# Patient Record
Sex: Female | Born: 1970 | Race: White | Hispanic: No | Marital: Married | Smoking: Never smoker
Health system: Southern US, Community
[De-identification: ages and names within clinical notes are randomized; demographics above are authoritative.]

## PROBLEM LIST (undated history)

## (undated) DIAGNOSIS — E28319 Asymptomatic premature menopause: Secondary | ICD-10-CM

## (undated) DIAGNOSIS — R87610 Atypical squamous cells of undetermined significance on cytologic smear of cervix (ASC-US): Secondary | ICD-10-CM

## (undated) HISTORY — DX: Asymptomatic premature menopause: E28.319

## (undated) HISTORY — DX: Atypical squamous cells of undetermined significance on cytologic smear of cervix (ASC-US): R87.610

## (undated) HISTORY — PX: CRYOTHERAPY: SHX1416

---

## 2008-03-22 ENCOUNTER — Ambulatory Visit: Payer: Self-pay

## 2008-07-12 HISTORY — PX: OTHER SURGICAL HISTORY: SHX169

## 2009-04-08 HISTORY — PX: COLPOSCOPY: SHX161

## 2011-09-16 ENCOUNTER — Ambulatory Visit: Payer: Self-pay

## 2015-01-01 DIAGNOSIS — R87610 Atypical squamous cells of undetermined significance on cytologic smear of cervix (ASC-US): Secondary | ICD-10-CM

## 2015-01-01 HISTORY — DX: Atypical squamous cells of undetermined significance on cytologic smear of cervix (ASC-US): R87.610

## 2015-08-20 ENCOUNTER — Ambulatory Visit (INDEPENDENT_AMBULATORY_CARE_PROVIDER_SITE_OTHER): Payer: BLUE CROSS/BLUE SHIELD | Admitting: Family Medicine

## 2015-08-20 ENCOUNTER — Encounter: Payer: Self-pay | Admitting: Family Medicine

## 2015-08-20 VITALS — BP 124/76 | HR 80 | Ht 64.0 in | Wt 125.0 lb

## 2015-08-20 DIAGNOSIS — Z136 Encounter for screening for cardiovascular disorders: Secondary | ICD-10-CM

## 2015-08-20 DIAGNOSIS — Z0130 Encounter for examination of blood pressure without abnormal findings: Secondary | ICD-10-CM

## 2015-08-20 NOTE — Progress Notes (Signed)
Name: Tanya Cooper   MRN: [id number]    DOB: Apr 29, 1971   Date:08/20/2015       Progress Note  Subjective  Chief Complaint  Chief Complaint  Patient presents with  . Hypertension    first noticed B/P elevated 3 weeks ago, came back down. Took again today- 178/86    Hypertension This is a new problem. The current episode started 1 to 4 weeks ago. The problem has been waxing and waning since onset. The problem is uncontrolled. Associated symptoms include anxiety and headaches. Pertinent negatives include no blurred vision, chest pain, malaise/fatigue, neck pain, orthopnea, palpitations, peripheral edema, PND, shortness of breath or sweats. Agents associated with hypertension include decongestants and NSAIDs. Risk factors for coronary artery disease include post-menopausal state and stress. Past treatments include nothing. The current treatment provides no improvement. Compliance problems include exercise and diet.  There is no history of angina, kidney disease, CAD/MI, CVA, heart failure, left ventricular hypertrophy, PVD, renovascular disease or retinopathy. There is no history of chronic renal disease or a hypertension causing med.    No problem-specific assessment & plan notes found for this encounter.   No past medical history on file.  No past surgical history on file.  No family history on file.  Social History   Social History  . Marital Status: Married    Spouse Name: N/A  . Number of Children: N/A  . Years of Education: N/A   Occupational History  . Not on file.   Social History Main Topics  . Smoking status: Never Smoker   . Smokeless tobacco: Not on file  . Alcohol Use: 0.0 oz/week    0 Standard drinks or equivalent per week  . Drug Use: No  . Sexual Activity: Not on file   Other Topics Concern  . Not on file   Social History Narrative  . No narrative on file    No Known Allergies   Review of Systems  Constitutional: Negative for fever, chills,  weight loss and malaise/fatigue.  HENT: Negative for ear discharge, ear pain and sore throat.   Eyes: Negative for blurred vision.  Respiratory: Negative for cough, sputum production, shortness of breath and wheezing.   Cardiovascular: Negative for chest pain, palpitations, orthopnea, leg swelling and PND.  Gastrointestinal: Negative for heartburn, nausea, abdominal pain, diarrhea, constipation, blood in stool and melena.  Genitourinary: Negative for dysuria, urgency, frequency and hematuria.  Musculoskeletal: Negative for myalgias, back pain, joint pain and neck pain.  Skin: Negative for rash.  Neurological: Positive for headaches. Negative for dizziness, tingling, sensory change and focal weakness.  Endo/Heme/Allergies: Negative for environmental allergies and polydipsia. Does not bruise/bleed easily.  Psychiatric/Behavioral: Negative for depression and suicidal ideas. The patient is not nervous/anxious and does not have insomnia.      Objective  Filed Vitals:   08/20/15 1539  BP: 124/76  Pulse: 80  Height:  (1.626 m)  Weight: 125 lb (56.7 kg)    Physical Exam  Constitutional: She is well-developed, well-nourished, and in no distress. No distress.  HENT:  Head: Normocephalic and atraumatic.  Right Ear: External ear normal.  Left Ear: External ear normal.  Nose: Nose normal.  Mouth/Throat: Oropharynx is clear and moist.  Eyes: Conjunctivae and EOM are normal. Pupils are equal, round, and reactive to light. Right eye exhibits no discharge. Left eye exhibits no discharge.  Neck: Normal range of motion. Neck supple. No JVD present. No thyromegaly present.  Cardiovascular: Normal rate, regular rhythm, normal heart  sounds and intact distal pulses.  Exam reveals no gallop and no friction rub.   No murmur heard. Pulmonary/Chest: Effort normal and breath sounds normal.  Abdominal: Soft. Bowel sounds are normal. She exhibits no mass. There is no tenderness. There is no guarding.   Musculoskeletal: Normal range of motion. She exhibits no edema.  Lymphadenopathy:    She has no cervical adenopathy.  Neurological: She is alert. She has normal reflexes.  Skin: Skin is warm and dry. She is not diaphoretic.  Psychiatric: Mood and affect normal.      Assessment & Plan  Problem List Items Addressed This Visit    None    Visit Diagnoses    Blood pressure check    -  Primary         Dr. Elizabeth Sauereanna Jones [facility]  08/20/2015

## 2015-08-20 NOTE — Patient Instructions (Signed)
High Blood Pressure: Low-Sodium Diet   Many people find that cutting down on sodium lowers their blood pressure. A low-sodium diet limits the amount of sodium in your diet to no more than 2300 milligrams a day. One teaspoon of salt has about 2300 milligrams of sodium.   Our taste for salt is mainly a habit. When you gradually lower the amount of salt in your diet, your taste begins to change. After a while, food begins to taste better without salt than it did with it.   Dietary Recommendations   Table salt added to foods is a common source of sodium in the diet. By not adding salt to foods, you can reduce the amount of sodium in your diet. But sodium is also found in canned and prepared foods, even if they don't taste salty. Learn which foods to avoid by reading labels to find out how much sodium is in the foods. You can reduce the amount of sodium in your diet by following these guidelines:   Read labels carefully. Look for any form of sodium or salt, such as sodium benzoate or sodium citrate. Choose foods that have less salt.  Add very little or no salt to food that you prepare.  Check the sodium content when you use baking powder, baking soda, and monosodium glutamate (MSG).  Do not add salt to food at the table.  Fast foods are very high in salt, as are many other restaurant foods. When you eat at a restaurant, try steamed fish and vegetables or fresh salads. Avoid soups.  Avoid eating the following foods:  ketchup, prepared mustard, pickles, and olives  soy sauce, steak or barbecue sauce, chili sauce, or Worcestershire sauce  bouillon cubes  commercially prepared or cured meats or fish (for example, bacon, luncheon meats, and canned sardines)  canned vegetables, soups, and other packaged convenience foods  salty cheeses and buttermilk  salted nuts and peanut butter  self-rising flour and biscuit mixes  salted crackers, chips, popcorn, and pretzels  commercial salad dressings  instant  cooked cereals.    Many of these foods are now available in unsalted or low-sodium versions. Read all labels carefully.  If your diet must be restricted to much lower amounts of sodium, talk to your health care provider and a registered dietitian for help in planning your meals. It is important to keep your meals nutritionally balanced and tasty. It can be hard to follow a restricted-salt diet if the food doesn't taste good, but there are many healthy ways to add taste without adding salt or fat.   Use of Salt Substitutes   Ask your health care provider about using salt substitutes. Most salt substitutes contain potassium for flavor. If you are taking certain medications, you may need to be careful about the amount of potassium in your diet.        Substitutions and Hints   Season foods with herbs and spices. Use onions, garlic, parsley, lemon and lime juice and rind, dill weed, basil, tarragon, marjoram, thyme, curry powder, turmeric, cumin, paprika, vinegar, or wine to enhance the flavor and aroma of foods. Mushrooms, celery, red pepper, yellow pepper, green pepper, and dried fruits also enhance specific dishes.  Eat fresh foods (instead of canned or packaged foods) as much as possible. Also, plain frozen fruits and vegetables usually do not have added salt.  Add a pinch of sugar or a squeeze of lemon juice to bring out the flavor in fresh vegetables.  If you must   use canned products, use the low-sodium types (except for fruit). Rinse canned vegetables with tap water before cooking.  Substitute unsalted, polyunsaturated margarine for regular margarine or butter.  Eat low-sodium cheeses. Many are available now, some with herbs and spices that are very tasty, and many are also low-fat.  Drink low-sodium juices.  Make unsalted or lightly salted soup stocks and keep them in the freezer to use as substitutes for canned broth and bouillon. Use these stocks to enhance vegetables.  Substitute  wines and vinegars (especially the flavored vinegars) for salt to enhance flavors.  Eat tuna and salmon and rinse first with running water.  Use herbs such as bay leaf, curry, turmeric, cumin, cilantro, dill, marjoram, paprika, pepper, tarragon, thyme, sage, onions, or garlic to season chicken, beef, or fish.  Cook rice in homemade broth with mushrooms and scallions or shallots.  Help Yourself Become Healthier   Check food labels for sodium and fat content.  Read nutrition information available at your [place] [employer] library, from the [employer], and through nutrition programs and health fairs. Ask your health care provider for printed information on nutrition, diet, and health.  Contact a dietitian for information.  Look for some of the excellent low-sodium cookbooks available in most bookstores.  Take time to plan and enjoy your meals. You will be pleasantly surprised at how fast you learn new food preparations, how lowering your sodium intake lowers your blood pressure, and how good food can be.How to Take Your Blood Pressure HOW DO I GET A BLOOD PRESSURE MACHINE?  You can buy an electronic home blood pressure machine at your local pharmacy. Insurance will sometimes cover the cost if you have a prescription.  Ask your doctor what type of machine is best for you. There are different machines for your arm and your wrist.  If you decide to buy a machine to check your blood pressure on your arm, first check the size of your arm so you can buy the right size cuff. To check the size of your arm:   Use a measuring tape that shows both inches and centimeters.   Wrap the measuring tape around the upper-middle part of your arm. You may need someone to help you measure.   Write down your arm measurement in both inches and centimeters.   To measure your blood pressure correctly, it is important to have the right size cuff.   If your arm is up to 13 inches (up to 34 centimeters), get an  adult cuff size.  If your arm is 13 to 17 inches (35 to 44 centimeters), get a large adult cuff size.    If your arm is 17 to 20 inches (45 to 52 centimeters), get an adult thigh cuff.  WHAT DO THE NUMBERS MEAN?   There are two numbers that make up your blood pressure. For example: 120/80.  The first number (120 in our example) is called the "systolic pressure." It is a measure of the pressure in your blood vessels when your heart is pumping blood.  The second number (80 in our example) is called the "diastolic pressure." It is a measure of the pressure in your blood vessels when your heart is resting between beats.  Your doctor will tell you what your blood pressure should be. WHAT SHOULD I DO BEFORE I CHECK MY BLOOD PRESSURE?   Try to rest or relax for at least 30 minutes before you check your blood pressure.  Do not smoke.  Do  not have any drinks with caffeine, such as:  Soda.  Coffee.  Tea.  Check your blood pressure in a quiet room.  Sit down and stretch out your arm on a table. Keep your arm at about the level of your heart. Let your arm relax.  Make sure that your legs are not crossed. HOW DO I CHECK MY BLOOD PRESSURE?  Follow the directions that came with your machine.  Make sure you remove any tight-fitting clothing from your arm or wrist. Wrap the cuff around your upper arm or wrist. You should be able to fit a finger between the cuff and your arm. If you cannot fit a finger between the cuff and your arm, it is too tight and should be removed and rewrapped.  Some units require you to manually pump up the arm cuff.  Automatic units inflate the cuff when you press a button.  Cuff deflation is automatic in both models.  After the cuff is inflated, the unit measures your blood pressure and pulse. The readings are shown on a monitor. Hold still and breathe normally while the cuff is inflated.  Getting a reading takes less than a minute.  Some models store  readings in a memory. Some provide a printout of readings. If your machine does not store your readings, keep a written record.  Take readings with you to your next visit with your doctor.   This information is not intended to replace advice given to you by your health care provider. Make sure you discuss any questions you have with your health care provider.   Document Released: 05/07/2008 Document Revised: 06/15/2014 Document Reviewed: 07/20/2013 Elsevier Interactive Patient Education [employer]

## 2015-12-02 DIAGNOSIS — H5213 Myopia, bilateral: Secondary | ICD-10-CM | POA: Diagnosis not present

## 2016-01-21 DIAGNOSIS — D485 Neoplasm of uncertain behavior of skin: Secondary | ICD-10-CM | POA: Diagnosis not present

## 2016-01-21 DIAGNOSIS — D225 Melanocytic nevi of trunk: Secondary | ICD-10-CM | POA: Diagnosis not present

## 2016-01-21 DIAGNOSIS — Z1283 Encounter for screening for malignant neoplasm of skin: Secondary | ICD-10-CM | POA: Diagnosis not present

## 2016-01-21 DIAGNOSIS — F4323 Adjustment disorder with mixed anxiety and depressed mood: Secondary | ICD-10-CM | POA: Diagnosis not present

## 2016-02-04 DIAGNOSIS — F4323 Adjustment disorder with mixed anxiety and depressed mood: Secondary | ICD-10-CM | POA: Diagnosis not present

## 2016-02-05 DIAGNOSIS — F4323 Adjustment disorder with mixed anxiety and depressed mood: Secondary | ICD-10-CM | POA: Diagnosis not present

## 2016-03-18 DIAGNOSIS — Z1329 Encounter for screening for other suspected endocrine disorder: Secondary | ICD-10-CM | POA: Diagnosis not present

## 2016-03-18 DIAGNOSIS — Z780 Asymptomatic menopausal state: Secondary | ICD-10-CM | POA: Diagnosis not present

## 2016-03-18 DIAGNOSIS — Z1389 Encounter for screening for other disorder: Secondary | ICD-10-CM | POA: Diagnosis not present

## 2016-03-18 DIAGNOSIS — Z01419 Encounter for gynecological examination (general) (routine) without abnormal findings: Secondary | ICD-10-CM | POA: Diagnosis not present

## 2016-03-26 ENCOUNTER — Other Ambulatory Visit: Payer: Self-pay | Admitting: Obstetrics and Gynecology

## 2016-03-26 ENCOUNTER — Other Ambulatory Visit: Payer: Self-pay | Admitting: Obstetrics & Gynecology

## 2016-03-26 DIAGNOSIS — Z1231 Encounter for screening mammogram for malignant neoplasm of breast: Secondary | ICD-10-CM

## 2016-03-31 ENCOUNTER — Ambulatory Visit
Admission: RE | Admit: 2016-03-31 | Discharge: 2016-03-31 | Disposition: A | Discharge status: Home / Self Care | Payer: BLUE CROSS/BLUE SHIELD | Source: Ambulatory Visit | Attending: Obstetrics and Gynecology | Admitting: Obstetrics and Gynecology

## 2016-03-31 DIAGNOSIS — Z1231 Encounter for screening mammogram for malignant neoplasm of breast: Secondary | ICD-10-CM | POA: Insufficient documentation

## 2017-04-06 ENCOUNTER — Ambulatory Visit (INDEPENDENT_AMBULATORY_CARE_PROVIDER_SITE_OTHER): Payer: BLUE CROSS/BLUE SHIELD | Admitting: Obstetrics and Gynecology

## 2017-04-06 ENCOUNTER — Encounter: Payer: Self-pay | Admitting: Obstetrics and Gynecology

## 2017-04-06 VITALS — BP 118/78 | Ht 64.0 in | Wt 129.0 lb

## 2017-04-06 DIAGNOSIS — Z1339 Encounter for screening examination for other mental health and behavioral disorders: Secondary | ICD-10-CM

## 2017-04-06 DIAGNOSIS — Z23 Encounter for immunization: Secondary | ICD-10-CM | POA: Diagnosis not present

## 2017-04-06 DIAGNOSIS — Z1331 Encounter for screening for depression: Secondary | ICD-10-CM

## 2017-04-06 DIAGNOSIS — Z01419 Encounter for gynecological examination (general) (routine) without abnormal findings: Secondary | ICD-10-CM | POA: Diagnosis not present

## 2017-04-06 DIAGNOSIS — Z124 Encounter for screening for malignant neoplasm of cervix: Secondary | ICD-10-CM

## 2017-04-06 NOTE — Progress Notes (Signed)
Gynecology Annual Exam   PCP: Conard NovakJackson, Yassir Enis D, MD  Chief Complaint  Patient presents with  . Annual Exam   History of Present Illness:  [name] is an established patient, 46 y.o. [phone] who LMP was No LMP recorded. Patient is postmenopausal., presents today for her annual examination.    She does occasionally have vasomotor sx. She uses no meds.  She is single partner, contraception - none. She does not have vaginal dryness.  Last Pap: 01/01/2015  Results were: ASCUS /neg HPV DNA.  Hx of STDs: HPV  Last mammogram: 03/31/2016  Results were: normal--routine follow-up in 12 months There is no FH of breast cancer. There is no FH of ovarian cancer. The patient does not do self-breast exams.  DEXA: has not been screened for osteoporosis  Tobacco use: The patient denies current or previous tobacco use. Alcohol use: social drinker Exercise: moderately active  The patient wears seatbelts: yes.     Past Medical History:  Diagnosis Date  . Abnormal Pap smear of cervix   . ASCUS of cervix with negative high risk HPV    Past Surgical History:  Procedure Laterality Date  . CRYOTHERAPY      Home Medications   Medication Sig Start Date End Date Taking? Authorizing Provider  Cholecalciferol (VITAMIN D PO) Take by mouth.   Yes [provider]  pseudoephedrine (SUDAFED) 120 MG 12 hr tablet Take 120 mg by mouth daily.   Yes [provider]   Allergies:No Known Allergies  Obstetric History: [id number]: G3P3003  Family History  Problem Relation Age of Onset  . Breast cancer Paternal Aunt [id number]       1 aunt and 1 great aunt  . Hypertension Mother     Social History   Social History  . Marital status: Married    Spouse name: N/A  . Number of children: N/A  . Years of education: N/A   Occupational History  . Not on file.   Social History Main Topics  . Smoking status: Never Smoker  . Smokeless tobacco: Never Used  . Alcohol use 0.0 oz/week  . Drug  use: No  . Sexual activity: Yes    Birth control/ protection: Post-menopausal   Other Topics Concern  . Not on file   Social History Narrative  . No narrative on file    Review of Systems  Constitutional: Negative.   HENT: Negative.   Eyes: Negative.   Respiratory: Negative.   Cardiovascular: Negative.   Gastrointestinal: Negative.   Genitourinary: Negative.   Musculoskeletal: Negative.   Skin: Negative.   Neurological: Negative.   Psychiatric/Behavioral: Negative.      Physical Exam BP 118/78   Ht 5\' 4"  (1.626 m)   Wt 129 lb (58.5 kg)   BMI 22.14 kg/m   Physical Exam  Constitutional: She is oriented to person, place, and time. She appears well-developed and well-nourished. No distress.  Genitourinary: Vagina normal and uterus normal. Pelvic exam was performed with patient supine. There is no rash, tenderness or lesion on the right labia. There is no rash, tenderness or lesion on the left labia. Right adnexum does not display mass, does not display tenderness and does not display fullness. Left adnexum does not display mass, does not display tenderness and does not display fullness. Cervix does not exhibit motion tenderness, lesion, discharge or polyp.   Uterus is mobile. Uterus is not enlarged, tender, exhibiting a mass or irregular (is regular).  HENT:  Head: Normocephalic and  atraumatic.  Eyes: EOM are normal. No scleral icterus.  Neck: Normal range of motion. Neck supple. No thyromegaly present.  Cardiovascular: Normal rate and regular rhythm.  Exam reveals no gallop and no friction rub.   No murmur heard. Pulmonary/Chest: Effort normal and breath sounds normal. No respiratory distress. She has no wheezes. She has no rales. Right breast exhibits no inverted nipple, no mass, no nipple discharge, no skin change and no tenderness. Left breast exhibits no inverted nipple, no mass, no nipple discharge, no skin change and no tenderness.  Abdominal: Soft. Bowel sounds are  normal. She exhibits no distension and no mass. There is no tenderness. There is no rebound and no guarding.  Musculoskeletal: Normal range of motion. She exhibits no edema.  Lymphadenopathy:    She has no cervical adenopathy.  Neurological: She is alert and oriented to person, place, and time. No cranial nerve deficit.  Skin: Skin is warm and dry. No erythema.  Psychiatric: She has a normal mood and affect. Her behavior is normal. Judgment normal.   Female chaperone present for pelvic and breast  portions of the physical exam  Results: AUDIT Questionnaire (screen for alcoholism): 3 PHQ-9: 1  Assessment: 46 y.o. G34P3003 female here for routine gynecologic examination.  Plan: Problem List Items Addressed This Visit    None    Visit Diagnoses    Women's annual routine gynecological examination    -  Primary   Relevant Orders   IGP, Aptima HPV, rfx 16/18,45   Screening for depression       Screening for alcohol problem       Pap smear for cervical cancer screening       Relevant Orders   IGP, Aptima HPV, rfx 16/18,45   Vaccine for diphtheria-tetanus-pertussis, combined         Screening: -- Blood pressure screen normal -- Colonoscopy - not due -- Mammogram - due. Patient to call [facility] to arrange. She understands that it is her responsibility to arrange this. -- Weight screening: normal -- Depression screening negative (PHQ-9) -- Nutrition: normal -- cholesterol screening: not due for screening -- osteoporosis screening: not due -- tobacco screening: not using -- alcohol screening: AUDIT questionnaire indicates low-risk usage. -- family history of breast cancer screening: done. not at high risk. -- no evidence of domestic violence or intimate partner violence. -- STD screening: gonorrhea/chlamydia NAAT not collected per patient request. -- pap smear collected per ASCCP guidelines -- flu vaccine received at work in late September -- HPV vaccination series: not eligilbe    -- TDaP today  Thomasene Mohair, MD 04/07/2017 11:25 AM

## 2017-04-07 DIAGNOSIS — Z01419 Encounter for gynecological examination (general) (routine) without abnormal findings: Secondary | ICD-10-CM | POA: Diagnosis not present

## 2017-04-07 DIAGNOSIS — Z23 Encounter for immunization: Secondary | ICD-10-CM | POA: Diagnosis not present

## 2017-04-08 ENCOUNTER — Encounter: Payer: Self-pay | Admitting: Obstetrics and Gynecology

## 2017-04-08 LAB — IGP, APTIMA HPV, RFX 16/18,45
HPV Aptima: NEGATIVE
PAP SMEAR COMMENT: 0

## 2017-05-04 ENCOUNTER — Other Ambulatory Visit: Payer: Self-pay | Admitting: Obstetrics and Gynecology

## 2017-05-04 DIAGNOSIS — Z1231 Encounter for screening mammogram for malignant neoplasm of breast: Secondary | ICD-10-CM

## 2017-05-20 ENCOUNTER — Ambulatory Visit
Admission: RE | Admit: 2017-05-20 | Discharge: 2017-05-20 | Disposition: A | Discharge status: Home / Self Care | Payer: BLUE CROSS/BLUE SHIELD | Source: Ambulatory Visit | Attending: Obstetrics and Gynecology | Admitting: Obstetrics and Gynecology

## 2017-05-20 DIAGNOSIS — Z1231 Encounter for screening mammogram for malignant neoplasm of breast: Secondary | ICD-10-CM | POA: Insufficient documentation

## 2018-02-03 IMAGING — MG MM DIGITAL SCREENING BILAT W/ CAD
4 series · 4 of 4 positions shown · non-contrast
Comparison: Previous exam(s).

CLINICAL DATA: Screening.

EXAM:
DIGITAL SCREENING BILATERAL MAMMOGRAM WITH CAD

[L MLO]
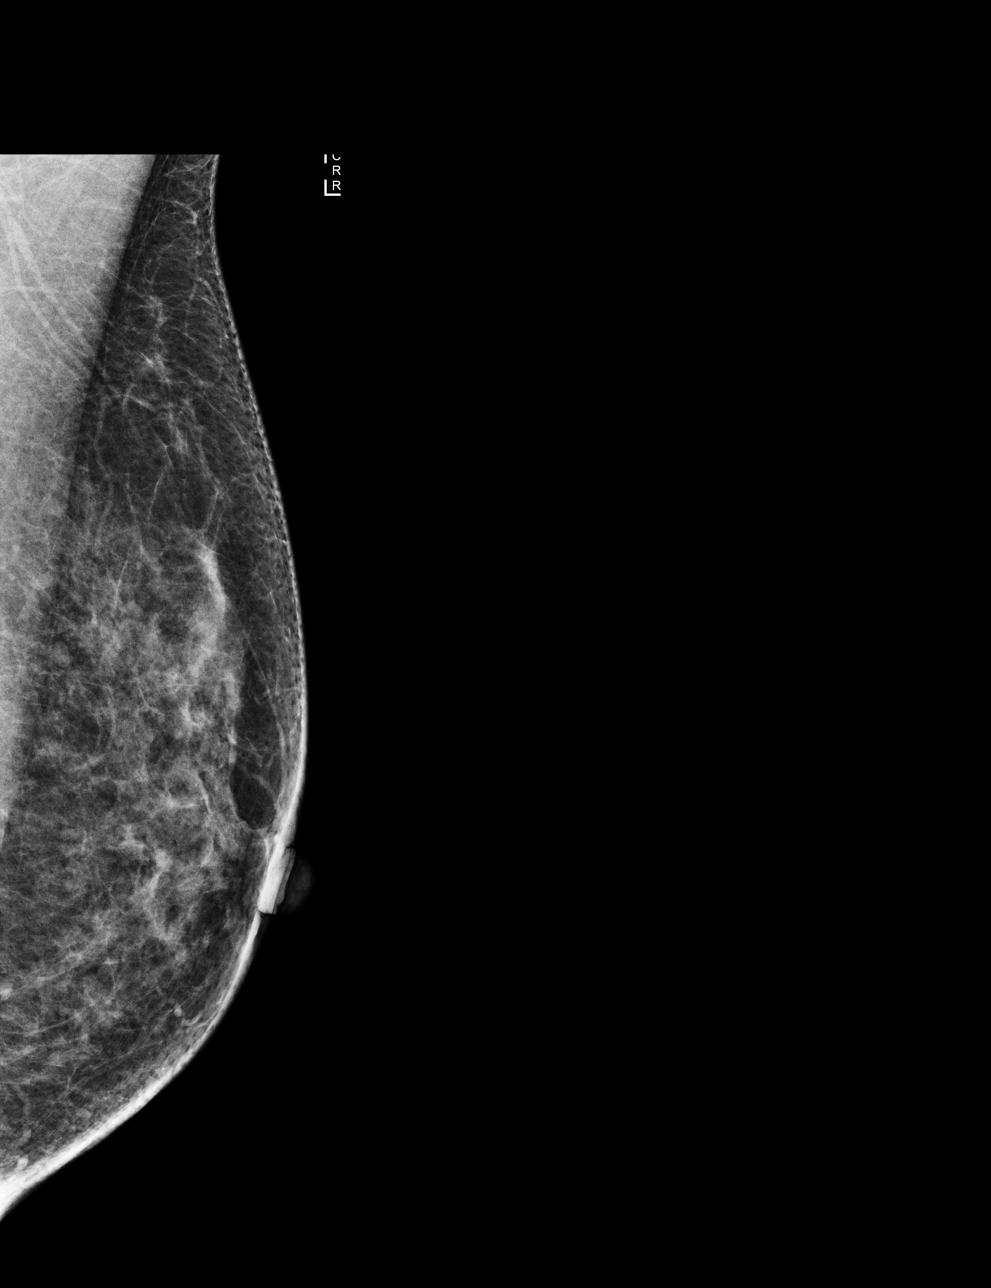

[R CC]
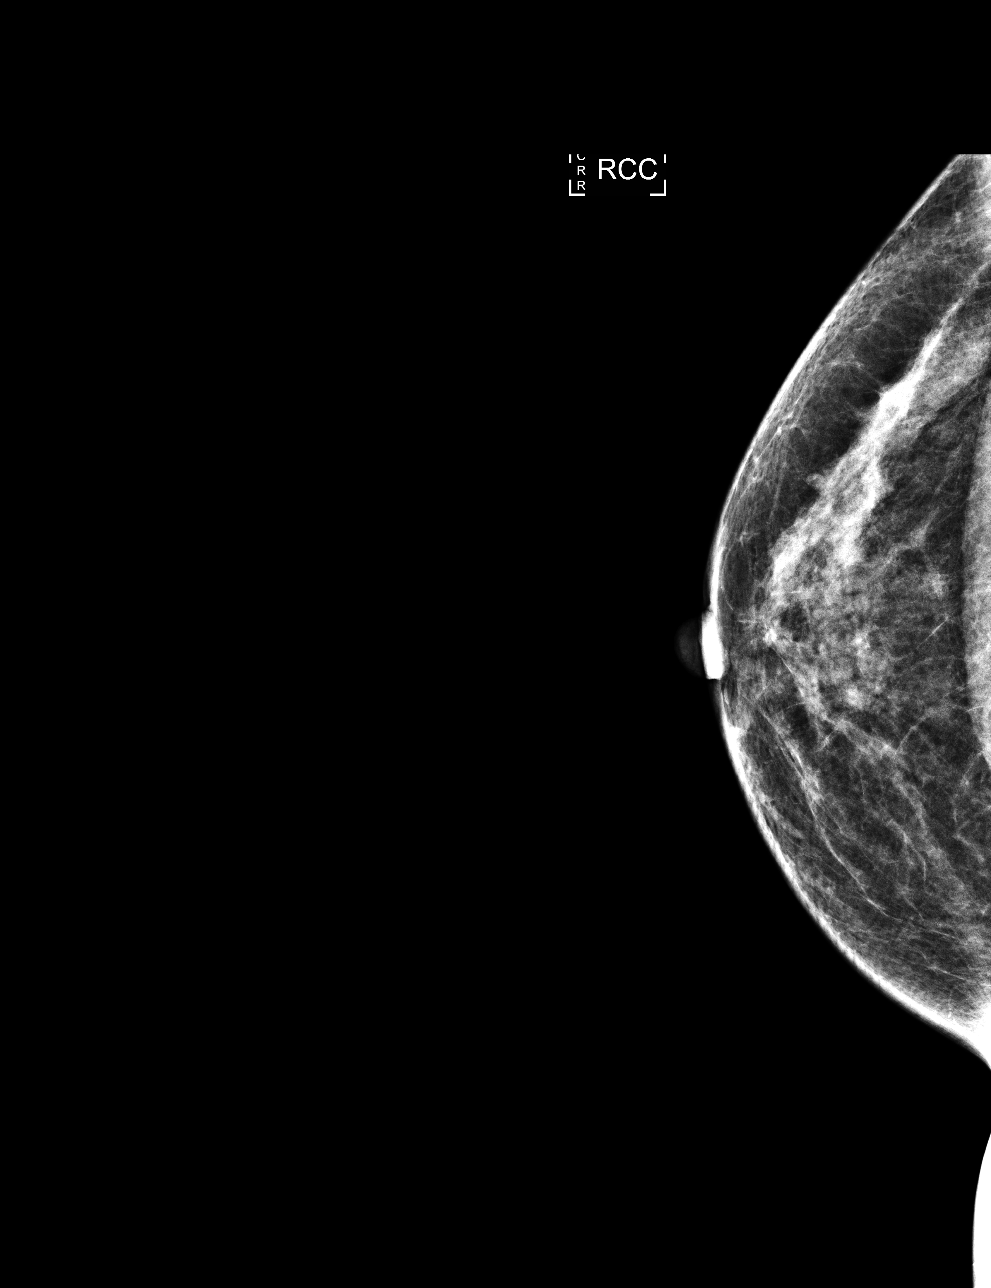

[R MLO]
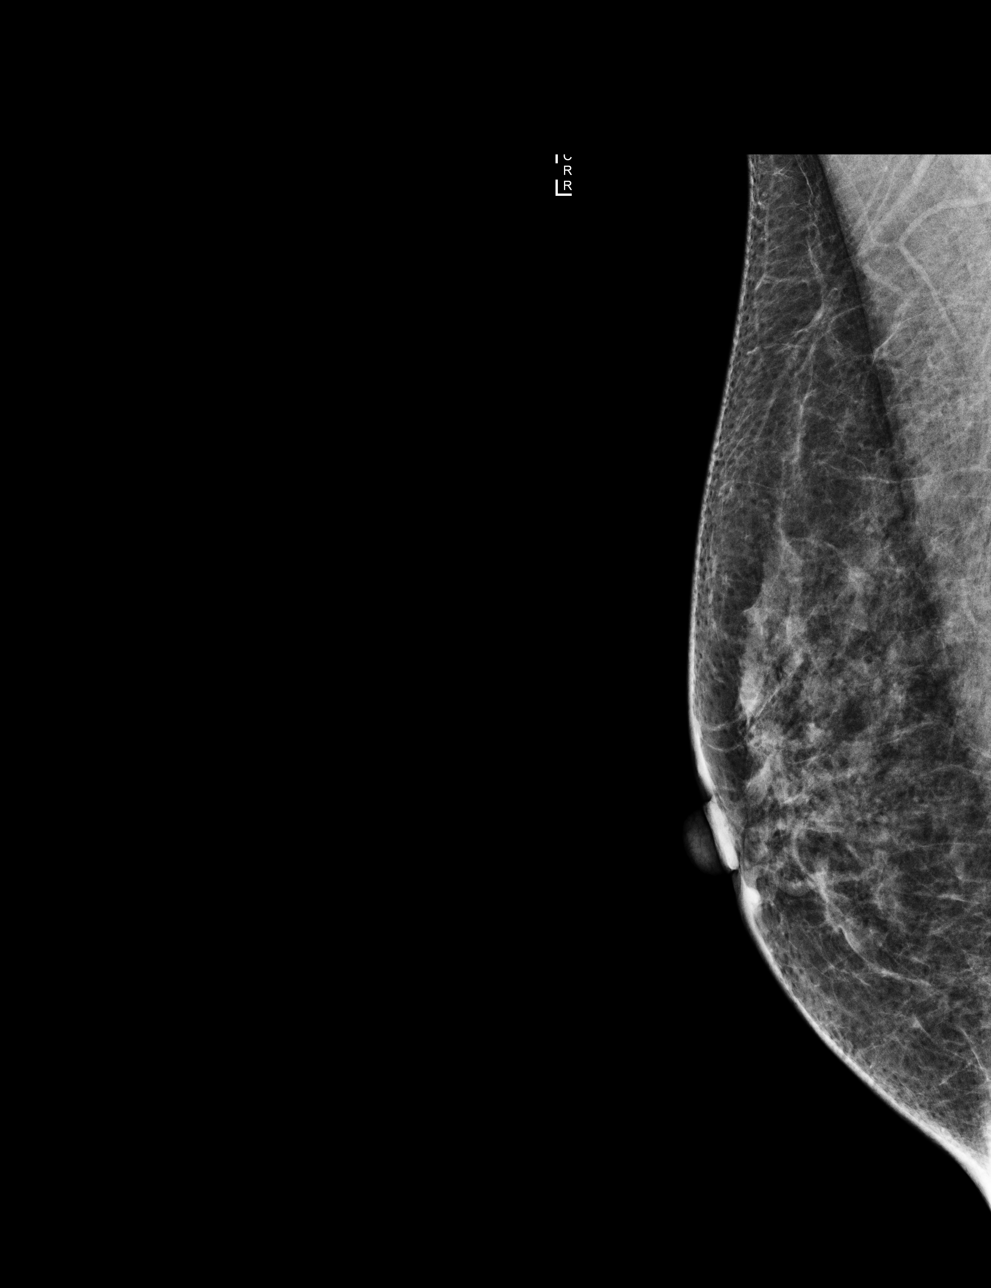

[L CC]
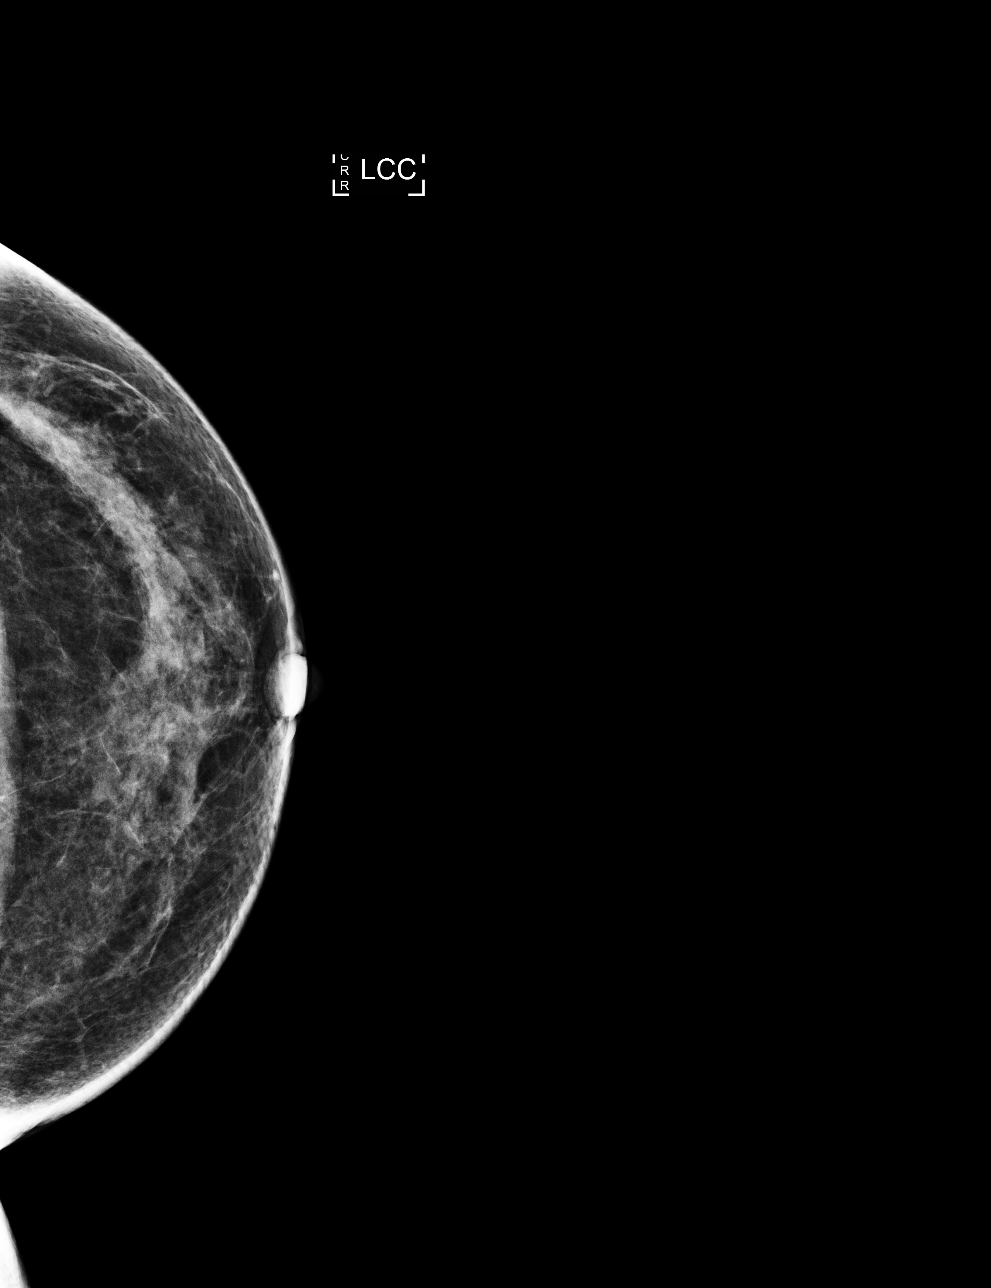

[4 of 4 positions shown; findings below may reference images not displayed]

ACR Breast Density Category b: There are scattered areas of
fibroglandular density.
FINDINGS: There are no findings suspicious for malignancy. Images were
processed with CAD.
IMPRESSION: No mammographic evidence of malignancy. A result letter of this
screening mammogram will be mailed directly to the patient.

RECOMMENDATION:
Screening mammogram in one year. (Code:AS-G-LCT)

BI-RADS CATEGORY  1: Negative.

## 2018-05-18 ENCOUNTER — Other Ambulatory Visit: Payer: Self-pay | Admitting: Obstetrics and Gynecology

## 2018-05-18 ENCOUNTER — Telehealth: Payer: Self-pay

## 2018-05-18 DIAGNOSIS — Z1239 Encounter for other screening for malignant neoplasm of breast: Secondary | ICD-10-CM

## 2018-05-18 NOTE — Telephone Encounter (Signed)
Pt has appt c [facility] in [place] on 06/09/18.  Wants mammogram done the same day in [place].  Needs order sent.  There are only two slots left that day.  [phone]

## 2018-05-18 NOTE — Telephone Encounter (Signed)
Order placed

## 2018-05-19 NOTE — Telephone Encounter (Signed)
Patient is aware the order has been placed, and that she can contact [facility] directly to schedule the appointment.

## 2018-06-09 ENCOUNTER — Encounter: Payer: Self-pay | Admitting: Obstetrics and Gynecology

## 2018-06-09 ENCOUNTER — Ambulatory Visit (INDEPENDENT_AMBULATORY_CARE_PROVIDER_SITE_OTHER): Payer: BLUE CROSS/BLUE SHIELD | Admitting: Obstetrics and Gynecology

## 2018-06-09 VITALS — BP 148/88 | HR 87 | Ht 64.0 in | Wt 134.0 lb

## 2018-06-09 DIAGNOSIS — Z1339 Encounter for screening examination for other mental health and behavioral disorders: Secondary | ICD-10-CM

## 2018-06-09 DIAGNOSIS — Z1331 Encounter for screening for depression: Secondary | ICD-10-CM

## 2018-06-09 DIAGNOSIS — Z01419 Encounter for gynecological examination (general) (routine) without abnormal findings: Secondary | ICD-10-CM

## 2018-06-09 NOTE — Progress Notes (Signed)
Gynecology Annual Exam  PCP: Conard Novak, MD  Chief Complaint  Patient presents with  . Gynecologic Exam   History of Present Illness:  [name] is a 48 y.o. [phone] who LMP was No LMP recorded. Patient is postmenopausal., presents today for her annual examination.    She does have vasomotor sx. She has occasional hot flashes.  She is sexually active. She is menopausal. She does not have vaginal dryness.  Last Pap:   04/06/2017: NILM, HPV negative 12/2014: ASCUS: HPV negative 12/2013: NILM, HPV negative 12/2012: NILM, HPV negative 05/2011: NILM, HPV negative  Hx of STDs: HPV  Last mammogram: 1 year  Results were: normal--routine follow-up in 12 months There is no FH of breast cancer. There is no FH of ovarian cancer. The patient does not do self-breast exams.  DEXA: has not been screened for osteoporosis  Tobacco use: The patient denies current or previous tobacco use. Alcohol use: social drinker Exercise: moderately active  The patient wears seatbelts: yes.     Past Medical History:  Diagnosis Date  . ASCUS of cervix with negative high risk HPV 01/01/2015   [place]  . Premature menopause     Past Surgical History:  Procedure Laterality Date  . COLPOSCOPY  04/2009  . CRYOTHERAPY    . Paragard  07/12/2008    Prior to Admission medications   Medication Sig Start Date End Date Taking? Authorizing Provider  Cholecalciferol (VITAMIN D PO) Take by mouth.    [provider]  pseudoephedrine (SUDAFED) 120 MG 12 hr tablet Take 120 mg by mouth daily.    [provider]   Allergies: No Known Allergies  Obstetric History: [id number]  Family History  Problem Relation Age of Onset  . Breast cancer Paternal Aunt 68       1 aunt and 1 great aunt  . Hypertension Mother     Social History   Socioeconomic History  . Marital status: Married    Spouse name: Not on file  . Number of children: Not on file  . Years of education: Not  on file  . Highest education level: Not on file  Occupational History  . Not on file  Social Needs  . Financial resource strain: Not on file  . Food insecurity:    Worry: Not on file    Inability: Not on file  . Transportation needs:    Medical: Not on file    Non-medical: Not on file  Tobacco Use  . Smoking status: Never Smoker  . Smokeless tobacco: Never Used  Substance and Sexual Activity  . Alcohol use: Yes    Alcohol/week: 0.0 standard drinks  . Drug use: No  . Sexual activity: Yes    Birth control/protection: Post-menopausal  Lifestyle  . Physical activity:    Days per week: Not on file    Minutes per session: Not on file  . Stress: Not on file  Relationships  . Social connections:    Talks on phone: Not on file    Gets together: Not on file    Attends religious service: Not on file    Active member of club or organization: Not on file    Attends meetings of clubs or organizations: Not on file    Relationship status: Not on file  . Intimate partner violence:    Fear of current or ex partner: Not on file    Emotionally abused: Not on file    Physically abused: Not on file  Forced sexual activity: Not on file  Other Topics Concern  . Not on file  Social History Narrative  . Not on file   Review of Systems  Constitutional: Negative.   HENT: Negative.   Eyes: Negative.   Respiratory: Negative.   Cardiovascular: Negative.   Gastrointestinal: Negative.   Genitourinary: Negative.   Musculoskeletal: Negative.   Skin: Negative.   Neurological: Negative.   Psychiatric/Behavioral: Negative.     Physical Exam BP (!) 148/88   Pulse 87   Ht 5\' 4"  (1.626 m)   Wt 134 lb (60.8 kg)   BMI 23.00 kg/m   Physical Exam Constitutional:      General: She is not in acute distress.    Appearance: She is well-developed.  Genitourinary:     Pelvic exam was performed with patient supine.     Uterus normal.     No inguinal adenopathy present in the right or left  side.    No signs of injury in the vagina.     No vaginal discharge, erythema, tenderness or bleeding.     No cervical motion tenderness, discharge, lesion or polyp.     Uterus is mobile.     Uterus is not enlarged or tender.     No uterine mass detected.    Uterus is anteverted.     No right or left adnexal mass present.     Right adnexa not tender or full.     Left adnexa not tender or full.  HENT:     Head: Normocephalic and atraumatic.  Eyes:     General: No scleral icterus. Neck:     Musculoskeletal: Normal range of motion and neck supple.     Thyroid: No thyromegaly.  Cardiovascular:     Rate and Rhythm: Normal rate and regular rhythm.     Heart sounds: No murmur. No friction rub. No gallop.   Pulmonary:     Effort: Pulmonary effort is normal. No respiratory distress.     Breath sounds: Normal breath sounds. No wheezing or rales.  Chest:     Breasts:        Right: No inverted nipple, mass, nipple discharge, skin change or tenderness.        Left: No inverted nipple, mass, nipple discharge, skin change or tenderness.  Abdominal:     General: Bowel sounds are normal. There is no distension.     Palpations: Abdomen is soft. There is no mass.     Tenderness: There is no abdominal tenderness. There is no guarding or rebound.  Musculoskeletal: Normal range of motion.        General: No tenderness.  Lymphadenopathy:     Cervical: No cervical adenopathy.     Lower Body: No right inguinal adenopathy. No left inguinal adenopathy.  Neurological:     Mental Status: She is alert and oriented to person, place, and time.     Cranial Nerves: No cranial nerve deficit.  Skin:    General: Skin is warm and dry.     Findings: No erythema or rash.  Psychiatric:        Behavior: Behavior normal.        Judgment: Judgment normal.    Female chaperone present for pelvic and breast  portions of the physical exam  Results: AUDIT Questionnaire (screen for alcoholism): 2 PHQ-9:  2  Assessment: 48 y.o. [id number] female here for routine gynecologic examination.  Plan: Problem List Items Addressed This Visit    None  Visit Diagnoses    Women's annual routine gynecological examination    -  Primary   Screening for depression       Screening for alcoholism          Screening: -- Blood pressure screen elevated: continued to monitor. -- Colonoscopy - not due -- [facility] - [facility] - [facility] -- Weight screening: normal -- Depression screening negative (PHQ-9) -- Nutrition: normal -- cholesterol screening: per PCP -- osteoporosis screening: not due -- tobacco screening: not using -- alcohol screening: AUDIT questionnaire indicates low-risk usage. -- family history of breast cancer screening: done. not at high risk. -- no evidence of domestic violence or intimate partner violence. -- STD screening: gonorrhea/chlamydia NAAT not collected per patient request. -- pap smear not collected per ASCCP guidelines -- flu vaccine declined this year -- HPV vaccination series: not eligilbe  Thomasene MohairStephen Raylee Adamec, MD 06/09/2018 4:13 PM

## 2018-06-13 DIAGNOSIS — H5213 Myopia, bilateral: Secondary | ICD-10-CM | POA: Diagnosis not present

## 2018-06-20 ENCOUNTER — Ambulatory Visit
Admission: RE | Admit: 2018-06-20 | Discharge: 2018-06-20 | Disposition: A | Discharge status: Home / Self Care | Payer: BLUE CROSS/BLUE SHIELD | Source: Ambulatory Visit | Attending: Obstetrics and Gynecology | Admitting: Obstetrics and Gynecology

## 2018-06-20 DIAGNOSIS — Z1239 Encounter for other screening for malignant neoplasm of breast: Secondary | ICD-10-CM | POA: Insufficient documentation

## 2018-06-20 DIAGNOSIS — Z1231 Encounter for screening mammogram for malignant neoplasm of breast: Secondary | ICD-10-CM | POA: Diagnosis not present

## 2018-10-11 DIAGNOSIS — T180XXA Foreign body in mouth, initial encounter: Secondary | ICD-10-CM | POA: Diagnosis not present

## 2018-11-10 DIAGNOSIS — M25551 Pain in right hip: Secondary | ICD-10-CM | POA: Diagnosis not present

## 2018-11-10 DIAGNOSIS — M25552 Pain in left hip: Secondary | ICD-10-CM | POA: Diagnosis not present

## 2018-11-10 DIAGNOSIS — S76311A Strain of muscle, fascia and tendon of the posterior muscle group at thigh level, right thigh, initial encounter: Secondary | ICD-10-CM | POA: Diagnosis not present

## 2018-11-10 DIAGNOSIS — S76312A Strain of muscle, fascia and tendon of the posterior muscle group at thigh level, left thigh, initial encounter: Secondary | ICD-10-CM | POA: Diagnosis not present

## 2018-11-11 DIAGNOSIS — M25551 Pain in right hip: Secondary | ICD-10-CM | POA: Diagnosis not present

## 2018-11-11 DIAGNOSIS — S76312A Strain of muscle, fascia and tendon of the posterior muscle group at thigh level, left thigh, initial encounter: Secondary | ICD-10-CM | POA: Diagnosis not present

## 2018-11-11 DIAGNOSIS — M25552 Pain in left hip: Secondary | ICD-10-CM | POA: Diagnosis not present

## 2018-11-11 DIAGNOSIS — S76311A Strain of muscle, fascia and tendon of the posterior muscle group at thigh level, right thigh, initial encounter: Secondary | ICD-10-CM | POA: Diagnosis not present

## 2018-11-15 DIAGNOSIS — S76312A Strain of muscle, fascia and tendon of the posterior muscle group at thigh level, left thigh, initial encounter: Secondary | ICD-10-CM | POA: Diagnosis not present

## 2018-11-15 DIAGNOSIS — M25552 Pain in left hip: Secondary | ICD-10-CM | POA: Diagnosis not present

## 2018-11-15 DIAGNOSIS — M25551 Pain in right hip: Secondary | ICD-10-CM | POA: Diagnosis not present

## 2018-11-15 DIAGNOSIS — S76311A Strain of muscle, fascia and tendon of the posterior muscle group at thigh level, right thigh, initial encounter: Secondary | ICD-10-CM | POA: Diagnosis not present

## 2018-11-18 DIAGNOSIS — S76312A Strain of muscle, fascia and tendon of the posterior muscle group at thigh level, left thigh, initial encounter: Secondary | ICD-10-CM | POA: Diagnosis not present

## 2018-11-18 DIAGNOSIS — S76311A Strain of muscle, fascia and tendon of the posterior muscle group at thigh level, right thigh, initial encounter: Secondary | ICD-10-CM | POA: Diagnosis not present

## 2018-11-18 DIAGNOSIS — M25551 Pain in right hip: Secondary | ICD-10-CM | POA: Diagnosis not present

## 2018-11-18 DIAGNOSIS — M25552 Pain in left hip: Secondary | ICD-10-CM | POA: Diagnosis not present

## 2018-11-22 DIAGNOSIS — M25551 Pain in right hip: Secondary | ICD-10-CM | POA: Diagnosis not present

## 2018-11-22 DIAGNOSIS — S76312A Strain of muscle, fascia and tendon of the posterior muscle group at thigh level, left thigh, initial encounter: Secondary | ICD-10-CM | POA: Diagnosis not present

## 2018-11-22 DIAGNOSIS — M25552 Pain in left hip: Secondary | ICD-10-CM | POA: Diagnosis not present

## 2018-11-22 DIAGNOSIS — S76311A Strain of muscle, fascia and tendon of the posterior muscle group at thigh level, right thigh, initial encounter: Secondary | ICD-10-CM | POA: Diagnosis not present

## 2018-11-25 DIAGNOSIS — S76312A Strain of muscle, fascia and tendon of the posterior muscle group at thigh level, left thigh, initial encounter: Secondary | ICD-10-CM | POA: Diagnosis not present

## 2018-11-25 DIAGNOSIS — S76311A Strain of muscle, fascia and tendon of the posterior muscle group at thigh level, right thigh, initial encounter: Secondary | ICD-10-CM | POA: Diagnosis not present

## 2018-11-25 DIAGNOSIS — M25552 Pain in left hip: Secondary | ICD-10-CM | POA: Diagnosis not present

## 2018-11-25 DIAGNOSIS — M25551 Pain in right hip: Secondary | ICD-10-CM | POA: Diagnosis not present

## 2018-11-29 DIAGNOSIS — M25552 Pain in left hip: Secondary | ICD-10-CM | POA: Diagnosis not present

## 2018-11-29 DIAGNOSIS — S76311A Strain of muscle, fascia and tendon of the posterior muscle group at thigh level, right thigh, initial encounter: Secondary | ICD-10-CM | POA: Diagnosis not present

## 2018-11-29 DIAGNOSIS — S76312A Strain of muscle, fascia and tendon of the posterior muscle group at thigh level, left thigh, initial encounter: Secondary | ICD-10-CM | POA: Diagnosis not present

## 2018-11-29 DIAGNOSIS — M25551 Pain in right hip: Secondary | ICD-10-CM | POA: Diagnosis not present

## 2018-12-01 DIAGNOSIS — S76312A Strain of muscle, fascia and tendon of the posterior muscle group at thigh level, left thigh, initial encounter: Secondary | ICD-10-CM | POA: Diagnosis not present

## 2018-12-01 DIAGNOSIS — M25552 Pain in left hip: Secondary | ICD-10-CM | POA: Diagnosis not present

## 2018-12-01 DIAGNOSIS — M25551 Pain in right hip: Secondary | ICD-10-CM | POA: Diagnosis not present

## 2018-12-01 DIAGNOSIS — S76311A Strain of muscle, fascia and tendon of the posterior muscle group at thigh level, right thigh, initial encounter: Secondary | ICD-10-CM | POA: Diagnosis not present

## 2018-12-06 DIAGNOSIS — S76311A Strain of muscle, fascia and tendon of the posterior muscle group at thigh level, right thigh, initial encounter: Secondary | ICD-10-CM | POA: Diagnosis not present

## 2018-12-06 DIAGNOSIS — M25552 Pain in left hip: Secondary | ICD-10-CM | POA: Diagnosis not present

## 2018-12-06 DIAGNOSIS — S76312A Strain of muscle, fascia and tendon of the posterior muscle group at thigh level, left thigh, initial encounter: Secondary | ICD-10-CM | POA: Diagnosis not present

## 2018-12-06 DIAGNOSIS — M25551 Pain in right hip: Secondary | ICD-10-CM | POA: Diagnosis not present

## 2018-12-16 DIAGNOSIS — S76312A Strain of muscle, fascia and tendon of the posterior muscle group at thigh level, left thigh, initial encounter: Secondary | ICD-10-CM | POA: Diagnosis not present

## 2018-12-16 DIAGNOSIS — M25552 Pain in left hip: Secondary | ICD-10-CM | POA: Diagnosis not present

## 2018-12-16 DIAGNOSIS — M25551 Pain in right hip: Secondary | ICD-10-CM | POA: Diagnosis not present

## 2018-12-16 DIAGNOSIS — S76311A Strain of muscle, fascia and tendon of the posterior muscle group at thigh level, right thigh, initial encounter: Secondary | ICD-10-CM | POA: Diagnosis not present

## 2018-12-19 DIAGNOSIS — M25552 Pain in left hip: Secondary | ICD-10-CM | POA: Diagnosis not present

## 2018-12-19 DIAGNOSIS — S76312A Strain of muscle, fascia and tendon of the posterior muscle group at thigh level, left thigh, initial encounter: Secondary | ICD-10-CM | POA: Diagnosis not present

## 2018-12-19 DIAGNOSIS — S76311A Strain of muscle, fascia and tendon of the posterior muscle group at thigh level, right thigh, initial encounter: Secondary | ICD-10-CM | POA: Diagnosis not present

## 2018-12-19 DIAGNOSIS — M25551 Pain in right hip: Secondary | ICD-10-CM | POA: Diagnosis not present

## 2018-12-22 DIAGNOSIS — M25551 Pain in right hip: Secondary | ICD-10-CM | POA: Diagnosis not present

## 2018-12-22 DIAGNOSIS — S76312A Strain of muscle, fascia and tendon of the posterior muscle group at thigh level, left thigh, initial encounter: Secondary | ICD-10-CM | POA: Diagnosis not present

## 2018-12-22 DIAGNOSIS — M25552 Pain in left hip: Secondary | ICD-10-CM | POA: Diagnosis not present

## 2018-12-22 DIAGNOSIS — S76311A Strain of muscle, fascia and tendon of the posterior muscle group at thigh level, right thigh, initial encounter: Secondary | ICD-10-CM | POA: Diagnosis not present

## 2018-12-30 DIAGNOSIS — S76311A Strain of muscle, fascia and tendon of the posterior muscle group at thigh level, right thigh, initial encounter: Secondary | ICD-10-CM | POA: Diagnosis not present

## 2018-12-30 DIAGNOSIS — M25551 Pain in right hip: Secondary | ICD-10-CM | POA: Diagnosis not present

## 2018-12-30 DIAGNOSIS — S76312A Strain of muscle, fascia and tendon of the posterior muscle group at thigh level, left thigh, initial encounter: Secondary | ICD-10-CM | POA: Diagnosis not present

## 2018-12-30 DIAGNOSIS — M25552 Pain in left hip: Secondary | ICD-10-CM | POA: Diagnosis not present

## 2019-05-25 DIAGNOSIS — Z03818 Encounter for observation for suspected exposure to other biological agents ruled out: Secondary | ICD-10-CM | POA: Diagnosis not present

## 2019-06-16 DIAGNOSIS — H5213 Myopia, bilateral: Secondary | ICD-10-CM | POA: Diagnosis not present

## 2019-11-15 ENCOUNTER — Ambulatory Visit: Payer: BLUE CROSS/BLUE SHIELD | Admitting: Obstetrics and Gynecology

## 2020-01-03 DIAGNOSIS — Z713 Dietary counseling and surveillance: Secondary | ICD-10-CM | POA: Diagnosis not present

## 2020-01-31 DIAGNOSIS — Z713 Dietary counseling and surveillance: Secondary | ICD-10-CM | POA: Diagnosis not present

## 2020-03-01 DIAGNOSIS — Z713 Dietary counseling and surveillance: Secondary | ICD-10-CM | POA: Diagnosis not present

## 2020-03-29 DIAGNOSIS — Z713 Dietary counseling and surveillance: Secondary | ICD-10-CM | POA: Diagnosis not present

## 2020-05-08 ENCOUNTER — Ambulatory Visit (INDEPENDENT_AMBULATORY_CARE_PROVIDER_SITE_OTHER): Payer: BC Managed Care – PPO | Admitting: Obstetrics and Gynecology

## 2020-05-08 ENCOUNTER — Other Ambulatory Visit: Payer: Self-pay

## 2020-05-08 ENCOUNTER — Encounter: Payer: Self-pay | Admitting: Obstetrics and Gynecology

## 2020-05-08 VITALS — BP 134/82 | HR 76 | Resp 16 | Ht 64.0 in | Wt 126.6 lb

## 2020-05-08 DIAGNOSIS — Z1339 Encounter for screening examination for other mental health and behavioral disorders: Secondary | ICD-10-CM | POA: Diagnosis not present

## 2020-05-08 DIAGNOSIS — Z01419 Encounter for gynecological examination (general) (routine) without abnormal findings: Secondary | ICD-10-CM

## 2020-05-08 DIAGNOSIS — Z1331 Encounter for screening for depression: Secondary | ICD-10-CM

## 2020-05-08 DIAGNOSIS — Z1231 Encounter for screening mammogram for malignant neoplasm of breast: Secondary | ICD-10-CM

## 2020-05-08 NOTE — Progress Notes (Signed)
Gynecology Annual Exam  PCP: Conard Novak, MD  Chief Complaint  Patient presents with  . Gynecologic Exam    ANNUAL EXAM   History of Present Illness:  [name] is a 49 y.o. [phone] who LMP was No LMP recorded. Patient is postmenopausal., presents today for her annual examination.    She does have vasomotor sx that are off and on (hot flashes)  She is sexually active. Her husband has had a vasectomy. She does have vaginal dryness (a little). She uses lubricant OTC as needed.   Last Pap: 03/2017  Results were: no abnormalities /neg HPV DNA negative Hx of STDs: none  Last mammogram: 06/2018  Results were: normal--routine follow-up in 12 months There is a FH of breast cancer in her maternal and paternal aunts. There is no FH of ovarian cancer. The patient does do self-breast exams.  Colonoscopy: never had DEXA: has not been screened for osteoporosis  Tobacco use: The patient denies current or previous tobacco use. Alcohol use: social drinker Exercise: runs 4 days per week, 20-25 miles per week.   The patient wears seatbelts: yes.     Past Medical History:  Diagnosis Date  . ASCUS of cervix with negative high risk HPV 01/01/2015   [place]  . Premature menopause     Past Surgical History:  Procedure Laterality Date  . COLPOSCOPY  04/2009  . CRYOTHERAPY    . Paragard  07/12/2008    Prior to Admission medications   Medication Sig Start Date End Date Taking? Authorizing Provider  Cholecalciferol (VITAMIN D PO) Take by mouth.    [provider]   Allergies:  No Known Allergies  Obstetric History: [id number]  Family History  Problem Relation Age of Onset  . Breast cancer Paternal Aunt 38       1 aunt and 1 great aunt  . Hypertension Mother   . Breast cancer Maternal Aunt 37    Social History   Socioeconomic History  . Marital status: Married    Spouse name: Not on file  . Number of children: Not on file  . Years of education: Not  on file  . Highest education level: Not on file  Occupational History  . Not on file  Tobacco Use  . Smoking status: Never Smoker  . Smokeless tobacco: Never Used  Substance and Sexual Activity  . Alcohol use: Yes    Alcohol/week: 0.0 standard drinks  . Drug use: No  . Sexual activity: Yes    Birth control/protection: Post-menopausal  Other Topics Concern  . Not on file  Social History Narrative  . Not on file   Social Determinants of Health   Financial Resource Strain:   . Difficulty of Paying Living Expenses: Not on file  Food Insecurity:   . Worried About Programme researcher, broadcasting/film/video in the Last Year: Not on file  . Ran Out of Food in the Last Year: Not on file  Transportation Needs:   . Lack of Transportation (Medical): Not on file  . Lack of Transportation (Non-Medical): Not on file  Physical Activity:   . Days of Exercise per Week: Not on file  . Minutes of Exercise per Session: Not on file  Stress:   . Feeling of Stress : Not on file  Social Connections:   . Frequency of Communication with Friends and Family: Not on file  . Frequency of Social Gatherings with Friends and Family: Not on file  . Attends Religious Services: Not on  file  . Active Member of Clubs or Organizations: Not on file  . Attends Banker Meetings: Not on file  . Marital Status: Not on file  Intimate Partner Violence:   . Fear of Current or Ex-Partner: Not on file  . Emotionally Abused: Not on file  . Physically Abused: Not on file  . Sexually Abused: Not on file    Review of Systems  Constitutional: Negative.   HENT: Negative.   Eyes: Negative.   Respiratory: Negative.   Cardiovascular: Negative.   Gastrointestinal: Negative.   Genitourinary: Negative.   Musculoskeletal: Negative.   Skin: Negative.   Neurological: Negative.   Psychiatric/Behavioral: Negative.      Physical Exam BP 134/82   Pulse 76   Resp 16   Ht 5\' 4"  (1.626 m)   Wt 126 lb 9.6 oz (57.4 kg)   SpO2 100%    BMI 21.73 kg/m   Physical Exam Constitutional:      General: She is not in acute distress.    Appearance: Normal appearance. She is well-developed.  Genitourinary:     Pelvic exam was performed with patient in the lithotomy position.     Vulva, urethra, bladder and uterus normal.     No inguinal adenopathy present in the right or left side.    No signs of injury in the vagina.     No vaginal discharge, erythema, tenderness or bleeding.     No cervical motion tenderness, discharge, lesion or polyp.     Uterus is mobile.     Uterus is not enlarged or tender.     No uterine mass detected.    Uterus is anteverted.     No right or left adnexal mass present.     Right adnexa not tender or full.     Left adnexa not tender or full.  HENT:     Head: Normocephalic and atraumatic.  Eyes:     General: No scleral icterus.    Conjunctiva/sclera: Conjunctivae normal.  Neck:     Thyroid: No thyromegaly.  Cardiovascular:     Rate and Rhythm: Normal rate and regular rhythm.     Heart sounds: No murmur heard.  No friction rub. No gallop.   Pulmonary:     Effort: Pulmonary effort is normal. No respiratory distress.     Breath sounds: Normal breath sounds. No wheezing or rales.  Chest:     Breasts:        Right: No inverted nipple, mass, nipple discharge, skin change or tenderness.        Left: No inverted nipple, mass, nipple discharge, skin change or tenderness.  Abdominal:     General: Bowel sounds are normal. There is no distension.     Palpations: Abdomen is soft. There is no mass.     Tenderness: There is no abdominal tenderness. There is no guarding or rebound.  Musculoskeletal:        General: No swelling or tenderness. Normal range of motion.     Cervical back: Normal range of motion and neck supple.  Lymphadenopathy:     Cervical: No cervical adenopathy.     Lower Body: No right inguinal adenopathy. No left inguinal adenopathy.  Neurological:     General: No focal deficit  present.     Mental Status: She is alert and oriented to person, place, and time.     Cranial Nerves: No cranial nerve deficit.  Skin:    General: Skin is warm and dry.  Findings: No erythema or rash.  Psychiatric:        Mood and Affect: Mood normal.        Behavior: Behavior normal.        Judgment: Judgment normal.     Female chaperone present for pelvic and breast  portions of the physical exam  Results: AUDIT Questionnaire (screen for alcoholism): 3 PHQ-9: 0  Assessment: 49 y.o. [id number] female here for routine gynecologic examination.  Plan: Problem List Items Addressed This Visit    None    Visit Diagnoses    Women's annual routine gynecological examination    -  Primary   Relevant Orders   MM Digital Screening   Screening for depression       Screening for alcoholism       Encounter for screening mammogram for malignant neoplasm of breast       Relevant Orders   MM Digital Screening      Screening: -- Blood pressure screen normal -- Colonoscopy - due - will schedule, likely once she turns 49 yo (per her request). She may elect to utilize Cologard.   -- Mammogram - due. Patient to call [facility] to arrange. She understands that it is her responsibility to arrange this. -- Weight screening: normal -- Depression screening negative (PHQ-9) -- Nutrition: normal -- cholesterol screening: per PCP -- osteoporosis screening: not due -- tobacco screening: not using -- alcohol screening: AUDIT questionnaire indicates low-risk usage. -- family history of breast cancer screening: done. not at high risk. -- no evidence of domestic violence or intimate partner violence. -- STD screening: gonorrhea/chlamydia NAAT not collected per patient request. -- pap smear not collected per ASCCP guidelines -- flu vaccine usually declines to get it. -- HPV vaccination series: not eligilbe  -- COVID19 vaccine: received Clearnce Hasten, MD 05/08/2020 1:20 PM

## 2020-07-30 ENCOUNTER — Other Ambulatory Visit: Payer: Self-pay | Admitting: Obstetrics and Gynecology

## 2020-07-30 DIAGNOSIS — Z1231 Encounter for screening mammogram for malignant neoplasm of breast: Secondary | ICD-10-CM

## 2020-08-20 ENCOUNTER — Ambulatory Visit
Admission: RE | Admit: 2020-08-20 | Discharge: 2020-08-20 | Disposition: A | Discharge status: Home / Self Care | Payer: 59 | Source: Ambulatory Visit | Attending: Obstetrics and Gynecology | Admitting: Obstetrics and Gynecology

## 2020-08-20 ENCOUNTER — Other Ambulatory Visit: Payer: Self-pay

## 2020-08-20 DIAGNOSIS — Z1231 Encounter for screening mammogram for malignant neoplasm of breast: Secondary | ICD-10-CM

## 2020-08-22 ENCOUNTER — Ambulatory Visit
Admission: RE | Admit: 2020-08-22 | Discharge: 2020-08-22 | Disposition: A | Discharge status: Home / Self Care | Payer: 59 | Source: Ambulatory Visit | Attending: Obstetrics and Gynecology | Admitting: Obstetrics and Gynecology

## 2020-08-22 ENCOUNTER — Other Ambulatory Visit: Payer: Self-pay

## 2020-08-22 DIAGNOSIS — Z1231 Encounter for screening mammogram for malignant neoplasm of breast: Secondary | ICD-10-CM | POA: Insufficient documentation

## 2021-08-07 NOTE — Progress Notes (Signed)
? ?PCP: Conard Novak, MD ? ? ?Chief Complaint  ?Patient presents with  ? Annual Exam  ? Menopause  ?  Menopausal symptoms  ? ? ?HPI: ?     [name] is a 51 y.o. [phone] whose LMP was No LMP recorded. Patient is postmenopausal., presents today for her annual examination.  Her menses are absent due to premature menopause age 30; not interested in HRT. No PMB. She does not have vasomotor sx.  ? ?Has had issues with wt gain over past few yrs. Is extremely active, eats well. Frustrated because weight hasn't changed.  ? ?Sex activity: single partner, contraception - post menopausal status. She does have vaginal dryness, improved with lubricants. ? ?Last Pap: 04/06/17 Results were: no abnormalities /neg HPV DNA. S/p colpo 2010. ? ?Last mammogram: 08/22/20  Results were: normal--routine follow-up in 12 months ?There is a FH of breast cancer in her mat aunt, pat aunt and pat grt aunt, genetic testing not indicated for pt. There is no FH of ovarian cancer. The patient does not do self-breast exams. ? ?Colonoscopy: never ? ?Tobacco use: The patient denies current or previous tobacco use. ?Alcohol use: none ?No drug use ?Exercise: very active ? ?She does not get adequate calcium but does get Vitamin D in her diet. ? ?No recent labs.  ? ?Past Medical History:  ?Diagnosis Date  ? ASCUS of cervix with negative high risk HPV 01/01/2015  ? [place]  ? Premature menopause   ? ? ?Past Surgical History:  ?Procedure Laterality Date  ? COLPOSCOPY  04/2009  ? CRYOTHERAPY    ? Paragard  07/12/2008  ? ? ?Family History  ?Problem Relation Age of Onset  ? Breast cancer Paternal Aunt 49  ?     1 aunt and 1 great aunt  ? Hypertension Mother   ? Breast cancer Maternal Aunt 72  ? ? ?Social History  ? ?Socioeconomic History  ? Marital status: Married  ?  Spouse name: Not on file  ? Number of children: Not on file  ? Years of education: Not on file  ? Highest education level: Not on file  ?Occupational History  ? Not on file   ?Tobacco Use  ? Smoking status: Never  ? Smokeless tobacco: Never  ?Vaping Use  ? Vaping Use: Never used  ?Substance and Sexual Activity  ? Alcohol use: Yes  ?  Comment: social  ? Drug use: No  ? Sexual activity: Yes  ?  Birth control/protection: Post-menopausal  ?Other Topics Concern  ? Not on file  ?Social History Narrative  ? Not on file  ? ?Social Determinants of Health  ? ?Financial Resource Strain: Not on file  ?Food Insecurity: Not on file  ?Transportation Needs: Not on file  ?Physical Activity: Not on file  ?Stress: Not on file  ?Social Connections: Not on file  ?Intimate Partner Violence: Not on file  ? ? ? ?Current Outpatient Medications:  ?  BIOTIN PO, Take by mouth., Disp: , Rfl:  ?  Cholecalciferol (VITAMIN D PO), Take by mouth., Disp: , Rfl:  ?  magnesium citrate SOLN, Take 296 mLs by mouth once., Disp: , Rfl:  ?  Omega-3 Fatty Acids (FISH OIL PO), Take by mouth., Disp: , Rfl:  ?  pseudoephedrine (SUDAFED) 120 MG 12 hr tablet, Take 120 mg by mouth daily., Disp: , Rfl:  ?  Turmeric (QC TUMERIC COMPLEX PO), Take by mouth., Disp: , Rfl:  ? ? ? ? ?ROS: ? ?Review  of Systems  ?Constitutional:  Negative for fatigue, fever and unexpected weight change.  ?Respiratory:  Negative for cough, shortness of breath and wheezing.   ?Cardiovascular:  Negative for chest pain, palpitations and leg swelling.  ?Gastrointestinal:  Negative for blood in stool, constipation, diarrhea, nausea and vomiting.  ?Endocrine: Negative for cold intolerance, heat intolerance and polyuria.  ?Genitourinary:  Negative for dyspareunia, dysuria, flank pain, frequency, genital sores, hematuria, menstrual problem, pelvic pain, urgency, vaginal bleeding, vaginal discharge and vaginal pain.  ?Musculoskeletal:  Negative for back pain, joint swelling and myalgias.  ?Skin:  Negative for rash.  ?Neurological:  Negative for dizziness, syncope, light-headedness, numbness and headaches.  ?Hematological:  Negative for adenopathy.   ?Psychiatric/Behavioral:  Negative for agitation, confusion, sleep disturbance and suicidal ideas. The patient is not nervous/anxious.   ?BREAST: No symptoms ? ? ? ?Objective: ?BP 138/82   Ht 5\' 4"  (1.626 m)   Wt 132 lb 3.2 oz (60 kg)   BMI 22.69 kg/m?  ? ? ?Physical Exam ?Constitutional:   ?   Appearance: She is well-developed.  ?Genitourinary:  ?   Vulva normal.  ?   Right Labia: No rash, tenderness or lesions. ?   Left Labia: No tenderness, lesions or rash. ?   No vaginal discharge, erythema or tenderness.  ? ?   Right Adnexa: not tender and no mass present. ?   Left Adnexa: not tender and no mass present. ?   No cervical friability or polyp.  ?   Uterus is not enlarged or tender.  ?Breasts: ?   Right: No mass, nipple discharge, skin change or tenderness.  ?   Left: No mass, nipple discharge, skin change or tenderness.  ?Neck:  ?   Thyroid: No thyromegaly.  ?Cardiovascular:  ?   Rate and Rhythm: Normal rate and regular rhythm.  ?   Heart sounds: Normal heart sounds. No murmur heard. ?Pulmonary:  ?   Effort: Pulmonary effort is normal.  ?   Breath sounds: Normal breath sounds.  ?Abdominal:  ?   Palpations: Abdomen is soft.  ?   Tenderness: There is no abdominal tenderness. There is no guarding or rebound.  ?Musculoskeletal:     ?   General: Normal range of motion.  ?   Cervical back: Normal range of motion.  ?Lymphadenopathy:  ?   Cervical: No cervical adenopathy.  ?Neurological:  ?   General: No focal deficit present.  ?   Mental Status: She is alert and oriented to person, place, and time.  ?   Cranial Nerves: No cranial nerve deficit.  ?Skin: ?   General: Skin is warm and dry.  ?Psychiatric:     ?   Mood and Affect: Mood normal.     ?   Behavior: Behavior normal.     ?   Thought Content: Thought content normal.     ?   Judgment: Judgment normal.  ?Vitals reviewed.  ? ? ?Assessment/Plan: ? ?Encounter for annual routine gynecological examination ? ?Cervical cancer screening - Plan: Cytology - PAP ? ?Screening  for HPV (human papillomavirus) - Plan: Cytology - PAP ? ?Encounter for screening mammogram for malignant neoplasm of breast - Plan: MM 3D SCREEN BREAST BILATERAL; pt to schedule mammo ? ?Screening for colon cancer - Plan: Cologuard; colonoscopy/cologuard discussed. Pt elects cologuard. Ref sent. Will f/u with results. ? ?Blood tests for routine general physical examination - Plan: Comprehensive metabolic panel, Lipid panel, Comprehensive metabolic panel, Lipid panel ? ?Screening cholesterol level - Plan: Lipid  panel, Lipid panel ? ?        ?GYN counsel breast self exam, mammography screening, menopause, adequate intake of calcium and vitamin D, diet and exercise ? ?  F/U ? Return in about 1 year (around 08/12/2022). ? ?Glyndon Tursi B. Venetta Knee, PA-C ?08/11/2021 ?9:02 AM ?

## 2021-08-11 ENCOUNTER — Encounter: Payer: Self-pay | Admitting: Obstetrics and Gynecology

## 2021-08-11 ENCOUNTER — Other Ambulatory Visit (HOSPITAL_COMMUNITY)
Admission: RE | Admit: 2021-08-11 | Discharge: 2021-08-11 | Disposition: A | Discharge status: Home / Self Care | Payer: Managed Care, Other (non HMO) | Source: Ambulatory Visit | Attending: Obstetrics and Gynecology | Admitting: Obstetrics and Gynecology

## 2021-08-11 ENCOUNTER — Other Ambulatory Visit: Payer: Self-pay

## 2021-08-11 ENCOUNTER — Ambulatory Visit (INDEPENDENT_AMBULATORY_CARE_PROVIDER_SITE_OTHER): Payer: Managed Care, Other (non HMO) | Admitting: Obstetrics and Gynecology

## 2021-08-11 VITALS — BP 138/82 | Ht 64.0 in | Wt 132.2 lb

## 2021-08-11 DIAGNOSIS — Z1211 Encounter for screening for malignant neoplasm of colon: Secondary | ICD-10-CM

## 2021-08-11 DIAGNOSIS — Z13220 Encounter for screening for lipoid disorders: Secondary | ICD-10-CM

## 2021-08-11 DIAGNOSIS — Z1151 Encounter for screening for human papillomavirus (HPV): Secondary | ICD-10-CM | POA: Diagnosis not present

## 2021-08-11 DIAGNOSIS — Z01419 Encounter for gynecological examination (general) (routine) without abnormal findings: Secondary | ICD-10-CM | POA: Diagnosis not present

## 2021-08-11 DIAGNOSIS — Z124 Encounter for screening for malignant neoplasm of cervix: Secondary | ICD-10-CM | POA: Insufficient documentation

## 2021-08-11 DIAGNOSIS — Z1231 Encounter for screening mammogram for malignant neoplasm of breast: Secondary | ICD-10-CM | POA: Diagnosis not present

## 2021-08-11 DIAGNOSIS — Z0000 Encounter for general adult medical examination without abnormal findings: Secondary | ICD-10-CM

## 2021-08-11 NOTE — Patient Instructions (Addendum)
I value your feedback and you entrusting us with your care. If you get a [facility] patient survey, I would appreciate you taking the time to let us know about your experience today. Thank you!  [facility]: [phone]      

## 2021-08-12 LAB — CYTOLOGY - PAP
Comment: NEGATIVE
Diagnosis: NEGATIVE
High risk HPV: NEGATIVE

## 2021-08-28 ENCOUNTER — Ambulatory Visit: Payer: 59

## 2021-09-01 ENCOUNTER — Other Ambulatory Visit: Payer: Self-pay

## 2021-09-01 ENCOUNTER — Ambulatory Visit
Admission: RE | Admit: 2021-09-01 | Discharge: 2021-09-01 | Disposition: A | Discharge status: Home / Self Care | Payer: Managed Care, Other (non HMO) | Source: Ambulatory Visit | Attending: Obstetrics and Gynecology | Admitting: Obstetrics and Gynecology

## 2021-09-01 DIAGNOSIS — Z1231 Encounter for screening mammogram for malignant neoplasm of breast: Secondary | ICD-10-CM | POA: Insufficient documentation

## 2021-09-02 LAB — COMPREHENSIVE METABOLIC PANEL
ALT: 13 IU/L (ref 0–32)
AST: 20 IU/L (ref 0–40)
Albumin/Globulin Ratio: 2.4 — ABNORMAL HIGH (ref 1.2–2.2)
Albumin: 4.8 g/dL (ref 3.8–4.8)
Alkaline Phosphatase: 55 IU/L (ref 44–121)
BUN/Creatinine Ratio: 14 (ref 9–23)
BUN: 12 mg/dL (ref 6–24)
Bilirubin Total: 0.7 mg/dL (ref 0.0–1.2)
CO2: 26 mmol/L (ref 20–29)
Calcium: 9.5 mg/dL (ref 8.7–10.2)
Chloride: 103 mmol/L (ref 96–106)
Creatinine, Ser: 0.87 mg/dL (ref 0.57–1.00)
Globulin, Total: 2 g/dL (ref 1.5–4.5)
Glucose: 104 mg/dL — ABNORMAL HIGH (ref 70–99)
Potassium: 4.1 mmol/L (ref 3.5–5.2)
Sodium: 142 mmol/L (ref 134–144)
Total Protein: 6.8 g/dL (ref 6.0–8.5)
eGFR: 81 mL/min/{1.73_m2} (ref >59)

## 2021-09-02 LAB — LIPID PANEL
Chol/HDL Ratio: 2.4 ratio (ref 0.0–4.4)
Cholesterol, Total: 257 mg/dL — ABNORMAL HIGH (ref 100–199)
HDL: 105 mg/dL (ref >39)
LDL Chol Calc (NIH): 141 mg/dL — ABNORMAL HIGH (ref 0–99)
Triglycerides: 70 mg/dL (ref 0–149)
VLDL Cholesterol Cal: 11 mg/dL (ref 5–40)

## 2021-09-08 LAB — COLOGUARD: COLOGUARD: POSITIVE — AB

## 2021-09-09 ENCOUNTER — Encounter: Payer: Self-pay | Admitting: Obstetrics and Gynecology

## 2021-09-09 DIAGNOSIS — R195 Other fecal abnormalities: Secondary | ICD-10-CM

## 2021-09-11 ENCOUNTER — Telehealth: Payer: Self-pay

## 2021-09-11 DIAGNOSIS — R195 Other fecal abnormalities: Secondary | ICD-10-CM | POA: Insufficient documentation

## 2021-09-11 NOTE — Telephone Encounter (Signed)
CALLED PATIENT NO ANSWER LEFT VOICEMAIL FOR A CALL BACK ? ?

## 2021-09-16 ENCOUNTER — Telehealth: Payer: Self-pay

## 2021-09-16 NOTE — Telephone Encounter (Signed)
CALLED PATIENT NO ANSWER LEFT VOICEMAIL FOR A CALL BACK LETTER SENT 

## 2021-09-25 NOTE — Telephone Encounter (Signed)
Patient left vm returning your call. Requesting a call back.  ?

## 2021-09-26 ENCOUNTER — Telehealth: Payer: Self-pay

## 2021-09-26 ENCOUNTER — Other Ambulatory Visit: Payer: Self-pay

## 2021-09-26 DIAGNOSIS — R195 Other fecal abnormalities: Secondary | ICD-10-CM

## 2021-09-26 MED ORDER — PEG 3350-KCL-NA BICARB-NACL 420 G PO SOLR: 4000.0000 mL | Freq: Once | ORAL | 0 refills | Status: AC

## 2021-09-26 NOTE — Telephone Encounter (Signed)
CALLED PATIENT NO ANSWER LEFT VOICEMAIL FOR A CALL BACK ? ?

## 2021-09-26 NOTE — Progress Notes (Signed)
Gastroenterology Pre-Procedure Review ? ?Request Date: 10/16/2021 ?Requesting Physician: Dr. Servando Snare ? ?PATIENT REVIEW QUESTIONS: The patient responded to the following health history questions as indicated:   ? ?1. Are you having any GI issues? no ?2. Do you have a personal history of Polyps? no ?3. Do you have a family history of Colon Cancer or Polyps? no ?4. Diabetes Mellitus? no ?5. Joint replacements in the past 12 months?no ?6. Major health problems in the past 3 months?no ?7. Any artificial heart valves, MVP, or defibrillator?no ?   ?MEDICATIONS & ALLERGIES:    ?Patient reports the following regarding taking any anticoagulation/antiplatelet therapy:   ?Plavix, Coumadin, Eliquis, Xarelto, Lovenox, Pradaxa, Brilinta, or Effient? no ?Aspirin? no ? ?Patient confirms/reports the following medications:  ?Current Outpatient Medications  ?Medication Sig Dispense Refill  ? BIOTIN PO Take by mouth.    ? Cholecalciferol (VITAMIN D PO) Take by mouth.    ? magnesium citrate SOLN Take 296 mLs by mouth once.    ? Omega-3 Fatty Acids (FISH OIL PO) Take by mouth.    ? pseudoephedrine (SUDAFED) 120 MG 12 hr tablet Take 120 mg by mouth daily.    ? Turmeric (QC TUMERIC COMPLEX PO) Take by mouth.    ? ?No current facility-administered medications for this visit.  ? ? ?Patient confirms/reports the following allergies:  ?No Known Allergies ? ?No orders of the defined types were placed in this encounter. ? ? ?AUTHORIZATION INFORMATION ?Primary Insurance: ?1D#: ?Group #: ? ?Secondary Insurance: ?1D#: ?Group #: ? ?SCHEDULE INFORMATION: ?Date: 10/16/2021 ?Time: ?Location:[facility] ? ? ?

## 2021-10-02 ENCOUNTER — Encounter: Payer: Self-pay | Admitting: Gastroenterology

## 2021-10-13 ENCOUNTER — Telehealth: Payer: Self-pay

## 2021-10-13 NOTE — Telephone Encounter (Signed)
Patient left a voicemail with questions why her colonoscopy is not schedule as a screening colonoscopy. Informed patient that colonoscopy screening was her cologuard test since her was positive it turns into a diagnostic colonoscopy. She states she will talk to her PCP then  ?

## 2021-10-16 ENCOUNTER — Ambulatory Visit
Admission: RE | Admit: 2021-10-16 | Discharge: 2021-10-16 | Disposition: A | Discharge status: Home / Self Care | Payer: Managed Care, Other (non HMO) | Attending: Gastroenterology | Admitting: Gastroenterology

## 2021-10-16 ENCOUNTER — Other Ambulatory Visit: Payer: Self-pay

## 2021-10-16 ENCOUNTER — Encounter
Admission: RE | Disposition: A | Discharge status: Home / Self Care | Payer: Self-pay | Source: Home / Self Care | Attending: Gastroenterology

## 2021-10-16 ENCOUNTER — Ambulatory Visit: Payer: Managed Care, Other (non HMO) | Admitting: Anesthesiology

## 2021-10-16 DIAGNOSIS — Z1211 Encounter for screening for malignant neoplasm of colon: Secondary | ICD-10-CM | POA: Diagnosis present

## 2021-10-16 DIAGNOSIS — R195 Other fecal abnormalities: Secondary | ICD-10-CM | POA: Diagnosis not present

## 2021-10-16 HISTORY — PX: COLONOSCOPY WITH PROPOFOL: SHX5780

## 2021-10-16 SURGERY — COLONOSCOPY WITH PROPOFOL
Anesthesia: General | Site: Rectum

## 2021-10-16 MED ORDER — ACETAMINOPHEN 325 MG PO TABS: 325.0000 mg | ORAL_TABLET | ORAL | Status: DC | PRN

## 2021-10-16 MED ORDER — LACTATED RINGERS IV SOLN: INTRAVENOUS | Status: DC

## 2021-10-16 MED ORDER — ONDANSETRON HCL 4 MG/2ML IJ SOLN: 4.0000 mg | Freq: Once | INTRAMUSCULAR | Status: DC | PRN

## 2021-10-16 MED ORDER — ACETAMINOPHEN 160 MG/5ML PO SOLN: 325.0000 mg | ORAL | Status: DC | PRN

## 2021-10-16 MED ORDER — LIDOCAINE HCL (CARDIAC) PF 100 MG/5ML IV SOSY
PREFILLED_SYRINGE | INTRAVENOUS | Status: DC | PRN
  Administered 2021-10-16: 30 mg via INTRAVENOUS

## 2021-10-16 MED ORDER — STERILE WATER FOR IRRIGATION IR SOLN
Status: DC | PRN
  Administered 2021-10-16: 100 mL

## 2021-10-16 MED ORDER — SODIUM CHLORIDE 0.9 % IV SOLN: INTRAVENOUS | Status: DC

## 2021-10-16 MED ORDER — PROPOFOL 10 MG/ML IV BOLUS
INTRAVENOUS | Status: DC | PRN
  Administered 2021-10-16 (×3): 30 mg via INTRAVENOUS
  Administered 2021-10-16: 120 mg via INTRAVENOUS
  Administered 2021-10-16 (×2): 30 mg via INTRAVENOUS
  Administered 2021-10-16: 20 mg via INTRAVENOUS
  Administered 2021-10-16 (×4): 30 mg via INTRAVENOUS

## 2021-10-16 SURGICAL SUPPLY — 6 items
GOWN CVR UNV OPN BCK APRN NK (MISCELLANEOUS) ×2 IMPLANT
GOWN ISOL THUMB LOOP REG UNIV (MISCELLANEOUS) ×4
KIT PRC NS LF DISP ENDO (KITS) ×1 IMPLANT
KIT PROCEDURE OLYMPUS (KITS) ×2
MANIFOLD NEPTUNE II (INSTRUMENTS) ×2 IMPLANT
WATER STERILE IRR 250ML POUR (IV SOLUTION) ×2 IMPLANT

## 2021-10-16 NOTE — H&P (Signed)
? ?  Midge Minium, MD [facility] ?[phone] ?[place], [place] [address] ?Phone:[phone] ?Fax : [phone] ? ?Primary Care Physician:  [facility], [place] ?Primary Gastroenterologist:  Dr. Servando Snare ? ?Pre-Procedure History & Physical: ?HPI:  Tanya Cooper is a 51 y.o. female is here for an colonoscopy. ?  ?Past Medical History:  ?Diagnosis Date  ? ASCUS of cervix with negative high risk HPV 01/01/2015  ? [place]ow  ? Premature menopause   ? ? ?Past Surgical History:  ?Procedure Laterality Date  ? COLPOSCOPY  04/2009  ? CRYOTHERAPY    ? Paragard  07/12/2008  ? ? ?Prior to Admission medications   ?Medication Sig Start Date End Date Taking? Authorizing Provider  ?BIOTIN PO Take by mouth.   Yes [provider]  ?Cholecalciferol (VITAMIN D PO) Take by mouth.   Yes [provider]  ?magnesium citrate SOLN Take 296 mLs by mouth once.   Yes [provider]  ?Omega-3 Fatty Acids (FISH OIL PO) Take by mouth.   Yes [provider]  ?pseudoephedrine (SUDAFED) 120 MG 12 hr tablet Take 120 mg by mouth daily.   Yes [provider]  ?Turmeric (QC TUMERIC COMPLEX PO) Take by mouth.   Yes [provider]  ?vitamin E 180 MG (400 UNITS) capsule Take 400 Units by mouth daily.   Yes [provider]  ? ? ?Allergies as of 09/26/2021  ? (No Known Allergies)  ? ? ?Family History  ?Problem Relation Age of Onset  ? Breast cancer Paternal Aunt 56  ?     1 aunt and 1 great aunt  ? Hypertension Mother   ? Breast cancer Maternal Aunt 57  ? ? ?Social History  ? ?Socioeconomic History  ? Marital status: Married  ?  Spouse name: Not on file  ? Number of children: Not on file  ? Years of education: Not on file  ? Highest education level: Not on file  ?Occupational History  ? Not on file  ?Tobacco Use  ? Smoking status: Never  ? Smokeless tobacco: Never  ?Vaping Use  ? Vaping Use: Never used  ?Substance and Sexual Activity  ? Alcohol use: Yes  ?  Comment: social  ? Drug  use: No  ? Sexual activity: Yes  ?  Birth control/protection: Post-menopausal  ?Other Topics Concern  ? Not on file  ?Social History Narrative  ? Not on file  ? ?Social Determinants of Health  ? ?Financial Resource Strain: Not on file  ?Food Insecurity: Not on file  ?Transportation Needs: Not on file  ?Physical Activity: Not on file  ?Stress: Not on file  ?Social Connections: Not on file  ?Intimate Partner Violence: Not on file  ? ? ?Review of Systems: ?See HPI, otherwise negative ROS ? ?Physical Exam: ?There were no vitals taken for this visit. ?General:   Alert,  pleasant and cooperative in NAD ?Head:  Normocephalic and atraumatic. ?Neck:  Supple; no masses or thyromegaly. ?Lungs:  Clear throughout to auscultation.    ?Heart:  Regular rate and rhythm. ?Abdomen:  Soft, nontender and nondistended. Normal bowel sounds, without guarding, and without rebound.   ?Neurologic:  Alert and  oriented x4;  grossly normal neurologically. ? ?Impression/Plan: ?Tanya Cooper is here for an colonoscopy to be performed for positive cologuard ? ?Risks, benefits, limitations, and alternatives regarding  colonoscopy have been reviewed with the patient.  Questions have been answered.  All parties agreeable. ? ? ?Midge Minium, MD  10/16/2021, 8:36 AM ?

## 2021-10-16 NOTE — Anesthesia Postprocedure Evaluation (Signed)
Anesthesia Post Note ? ?Patient: Tanya Cooper ? ?Procedure(s) Performed: COLONOSCOPY WITH PROPOFOL (Rectum) ? ? ?  ?Patient location during evaluation: PACU ?Anesthesia Type: General ?Level of consciousness: awake and alert ?Pain management: pain level controlled ?Vital Signs Assessment: post-procedure vital signs reviewed and stable ?Respiratory status: spontaneous breathing, nonlabored ventilation, respiratory function stable and patient connected to nasal cannula oxygen ?Cardiovascular status: blood pressure returned to baseline and stable ?Postop Assessment: no apparent nausea or vomiting ?Anesthetic complications: no ? ? ?No notable events documented. ? ?[name] ? ? ? ? ? ?

## 2021-10-16 NOTE — Transfer of Care (Signed)
Immediate Anesthesia Transfer of Care Note ? ?Patient: Tanya Cooper ? ?Procedure(s) Performed: COLONOSCOPY WITH PROPOFOL (Rectum) ? ?Patient Location: PACU ? ?Anesthesia Type: General ? ?Level of Consciousness: awake, alert  and patient cooperative ? ?Airway and Oxygen Therapy: Patient Spontanous Breathing and Patient connected to supplemental oxygen ? ?Post-op Assessment: Post-op Vital signs reviewed, Patient's Cardiovascular Status Stable, Respiratory Function Stable, Patent Airway and No signs of Nausea or vomiting ? ?Post-op Vital Signs: Reviewed and stable ? ?Complications: No notable events documented. ? ?

## 2021-10-16 NOTE — Anesthesia Procedure Notes (Signed)
Date/Time: 10/16/2021 9:25 AM ?Performed by: Maree Krabbe, CRNA ?Pre-anesthesia Checklist: Patient identified, Emergency Drugs available, Suction available, Timeout performed and Patient being monitored ?Patient Re-evaluated:Patient Re-evaluated prior to induction ?Oxygen Delivery Method: Nasal cannula ?Placement Confirmation: positive ETCO2 ? ? ? ? ?

## 2021-10-16 NOTE — Op Note (Signed)
[facility] ?Gastroenterology ?Patient Name: Tanya Cooper ?Procedure Date: 10/16/2021 9:16 AM ?MRN: [id number] ?Account #: [id number] ?Date of Birth: 21-Apr-1971 ?Admit Type: Outpatient ?Age: 51 ?Room: [facility] OR ROOM 01 ?Gender: Female ?Note Status: Finalized ?Instrument Name: [id number] ?Procedure:             Colonoscopy ?Indications:           Positive Cologuard test ?Providers:             Midge Minium MD, MD ?Medicines:             Propofol per Anesthesia ?Complications:         No immediate complications. ?Procedure:             Pre-Anesthesia Assessment: ?                       - Prior to the procedure, a History and Physical was  ?                       performed, and patient medications and allergies were  ?                       reviewed. The patient's tolerance of previous  ?                       anesthesia was also reviewed. The risks and benefits  ?                       of the procedure and the sedation options and risks  ?                       were discussed with the patient. All questions were  ?                       answered, and informed consent was obtained. Prior  ?                       Anticoagulants: The patient has taken no previous  ?                       anticoagulant or antiplatelet agents. ASA Grade  ?                       Assessment: II - A patient with mild systemic disease.  ?                       After reviewing the risks and benefits, the patient  ?                       was deemed in satisfactory condition to undergo the  ?                       procedure. ?                       After obtaining informed consent, the colonoscope was  ?                       passed under direct vision. Throughout the procedure,  ?  the patient's blood pressure, pulse, and oxygen  ?                       saturations were monitored continuously. The  ?                       Colonoscope was introduced through the anus and  ?                       advanced to the the  cecum, identified by appendiceal  ?                       orifice and ileocecal valve. The colonoscopy was  ?                       performed without difficulty. The patient tolerated  ?                       the procedure well. The quality of the bowel  ?                       preparation was excellent. ?Findings: ?     The perianal and digital rectal examinations were normal. ?     The entire examined colon appeared normal. ?Impression:            - The entire examined colon is normal. ?                       - No specimens collected. ?Recommendation:        - Discharge patient to home. ?                       - Resume previous diet. ?                       - Continue present medications. ?                       - Repeat colonoscopy in 10 years for screening  ?                       purposes. ?Procedure Code(s):     --- Professional --- ?                       [id number], Colonoscopy, flexible; diagnostic, including  ?                       collection of specimen(s) by brushing or washing, when  ?                       performed (separate procedure) ?Diagnosis Code(s):     --- Professional --- ?                       R19.5, Other fecal abnormalities ?CPT copyright 2019 American Medical Association. All rights reserved. ?The codes documented in this report are preliminary and upon coder review may  ?be revised to meet current compliance requirements. ?Midge Minium MD, MD ?10/16/2021 9:48:45 AM ?This report has been signed electronically. ?Number of Addenda: 0 ?Note Initiated On: 10/16/2021 9:16 AM ?Scope  Withdrawal Time: 0 hours 9 minutes 26 seconds  ?Total Procedure Duration: 0 hours 12 minutes 10 seconds  ?Estimated Blood Loss:  Estimated blood loss: none. ?     [facility] ?

## 2021-10-16 NOTE — Anesthesia Preprocedure Evaluation (Signed)
Anesthesia Evaluation  ?Patient identified by MRN, date of birth, ID band ?Patient awake ? ? ? ?Reviewed: ?Allergy & Precautions, H&P , NPO status , Patient's Chart, lab work & pertinent test results, reviewed documented beta blocker date and time  ? ?Airway ?Mallampati: II ? ?TM Distance: >3 FB ?Neck ROM: full ? ? ? Dental ?no notable dental hx. ? ?  ?Pulmonary ?neg pulmonary ROS,  ?  ?Pulmonary exam normal ?breath sounds clear to auscultation ? ? ? ? ? ? Cardiovascular ?Exercise Tolerance: Good ?negative cardio ROS ? ? ?Rhythm:regular Rate:Normal ? ? ?  ?Neuro/Psych ?negative neurological ROS ? negative psych ROS  ? GI/Hepatic ?negative GI ROS, Neg liver ROS,   ?Endo/Other  ?negative endocrine ROS ? Renal/GU ?negative Renal ROS  ?negative genitourinary ?  ?Musculoskeletal ? ? Abdominal ?  ?Peds ? Hematology ?negative hematology ROS ?(+)   ?Anesthesia Other Findings ? ? Reproductive/Obstetrics ?negative OB ROS ? ?  ? ? ? ? ? ? ? ? ? ? ? ? ? ?  ?  ? ? ? ? ? ? ? ? ?Anesthesia Physical ?Anesthesia Plan ? ?ASA: 2 ? ?Anesthesia Plan: General  ? ?Post-op Pain Management:   ? ?Induction:  ? ?PONV Risk Score and Plan: 3 and Propofol infusion ? ?Airway Management Planned:  ? ?Additional Equipment:  ? ?Intra-op Plan:  ? ?Post-operative Plan:  ? ?Informed Consent: I have reviewed the patients History and Physical, chart, labs and discussed the procedure including the risks, benefits and alternatives for the proposed anesthesia with the patient or authorized representative who has indicated his/her understanding and acceptance.  ? ? ? ?Dental Advisory Given ? ?Plan Discussed with: CRNA ? ?Anesthesia Plan Comments:   ? ? ? ? ? ? ?Anesthesia Quick Evaluation ? ?

## 2021-10-17 ENCOUNTER — Encounter: Payer: Self-pay | Admitting: Gastroenterology

## 2022-03-20 ENCOUNTER — Ambulatory Visit
Admission: EM | Admit: 2022-03-20 | Discharge: 2022-03-20 | Disposition: A | Discharge status: Home / Self Care | Payer: Managed Care, Other (non HMO) | Attending: Physician Assistant | Admitting: Physician Assistant

## 2022-03-20 DIAGNOSIS — R238 Other skin changes: Secondary | ICD-10-CM | POA: Insufficient documentation

## 2022-03-20 MED ORDER — VALACYCLOVIR HCL 1 G PO TABS: 1000.0000 mg | ORAL_TABLET | Freq: Three times a day (TID) | ORAL | 0 refills | Status: AC

## 2022-03-20 NOTE — Discharge Instructions (Signed)
-  The rash that you have does look to be consistent with shingles.  We are testing the fluid to see but that will take a couple of days.  I have sent antiviral medication to the pharmacy for you to start right away.  As we discussed, it has been several days so it may not be as effective as it would have been if you would have started earlier. - If you are testing comes back negative for shingles you may discontinue it and we can send a topical steroid for you but I would avoid putting anything on the rash at this time.  He can take antihistamines for itching. - Try to keep it covered. - You are contagious to anyone who has not had chickenpox and you should avoid pregnant women, young children, babies, elderly or immunocompromised.

## 2022-03-20 NOTE — ED Provider Notes (Signed)
[facility]    CSN: [id number] Arrival date & time: 03/20/22  1003      History   Chief Complaint Chief Complaint  Patient presents with   Rash    Chest and left shoulder    HPI Tanya Cooper is a 51 y.o. female presenting for vesicular rash of the left side of her chest, shoulder and upper back for the past 4 to 5 days.  She says she is not having any pain in the area and initially thought she just had been bitten by bugs.  She says it is very itchy though and she has been applying, and lotion.  She says the rash continues to spread and she had a new area pop up on her left chest today.  Concern for shingles.  Patient has no history of shingles.  She has not had the shingles vaccine.  No other complaints.  HPI  Past Medical History:  Diagnosis Date   ASCUS of cervix with negative high risk HPV 01/01/2015   [place]osenow   Premature menopause     Patient Active Problem List   Diagnosis Date Noted   Positive colorectal cancer screening using Cologuard test 09/11/2021    Past Surgical History:  Procedure Laterality Date   COLONOSCOPY WITH PROPOFOL N/A 10/16/2021   Procedure: COLONOSCOPY WITH PROPOFOL;  Surgeon: Lucilla Lame, MD;  Location: [facility];  Service: Endoscopy;  Laterality: N/A;   COLPOSCOPY  04/2009   CRYOTHERAPY     Paragard  07/12/2008    OB History     Gravida  3   Para  3   Term  3   Preterm      AB      Living  3      SAB      IAB      Ectopic      Multiple      Live Births  3            Home Medications    Prior to Admission medications   Medication Sig Start Date End Date Taking? Authorizing Provider  valACYclovir (VALTREX) 1000 MG tablet Take 1 tablet (1,000 mg total) by mouth 3 (three) times daily for 7 days. 03/20/22 03/27/22 Yes Laurene Footman B, PA-C  BIOTIN PO Take by mouth.    [provider]  Cholecalciferol (VITAMIN D PO) Take by mouth.    [provider]  magnesium  citrate SOLN Take 296 mLs by mouth once.    [provider]  Omega-3 Fatty Acids (FISH OIL PO) Take by mouth.    [provider]  pseudoephedrine (SUDAFED) 120 MG 12 hr tablet Take 120 mg by mouth daily.    [provider]  Turmeric (QC TUMERIC COMPLEX PO) Take by mouth.    [provider]  vitamin E 180 MG (400 UNITS) capsule Take 400 Units by mouth daily.    [provider]    Family History Family History  Problem Relation Age of Onset   Breast cancer Paternal Aunt 11       1 aunt and 1 great aunt   Hypertension Mother    Breast cancer Maternal Aunt 62    Social History Social History   Tobacco Use   Smoking status: Never   Smokeless tobacco: Never  Vaping Use   Vaping Use: Never used  Substance Use Topics   Alcohol use: Yes    Comment: social   Drug use: No  Allergies   Patient has no known allergies.   Review of Systems Review of Systems  Constitutional:  Negative for fever.  Cardiovascular:  Negative for chest pain.  Musculoskeletal:  Negative for arthralgias and joint swelling.  Skin:  Positive for rash. Negative for wound.  Neurological:  Negative for weakness.     Physical Exam Triage Vital Signs ED Triage Vitals  Enc Vitals Group     BP 03/20/22 1011 (!) 147/90     Pulse Rate 03/20/22 1011 82     Resp --      Temp 03/20/22 1011 98.5 F (36.9 C)     Temp src --      SpO2 03/20/22 1011 100 %     Weight 03/20/22 1009 130 lb (59 kg)     Height 03/20/22 1009 5\' 4"  (1.626 m)     Head Circumference --      Peak Flow --      Pain Score 03/20/22 1009 0     Pain Loc --      Pain Edu? --      Excl. in GC? --    No data found.  Updated Vital Signs BP (!) 147/90 (BP Location: Left Arm)   Pulse 82   Temp 98.5 F (36.9 C)   Ht 5\' 4"  (1.626 m)   Wt 130 lb (59 kg)   SpO2 100%   BMI 22.31 kg/m    Physical Exam Vitals and nursing note reviewed.  Constitutional:      General: She is not in acute  distress.    Appearance: Normal appearance. She is not ill-appearing or toxic-appearing.  HENT:     Head: Normocephalic and atraumatic.  Eyes:     General: No scleral icterus.       Right eye: No discharge.        Left eye: No discharge.     Conjunctiva/sclera: Conjunctivae normal.  Cardiovascular:     Rate and Rhythm: Normal rate and regular rhythm.     Heart sounds: Normal heart sounds.  Pulmonary:     Effort: Pulmonary effort is normal. No respiratory distress.     Breath sounds: Normal breath sounds.  Musculoskeletal:     Cervical back: Neck supple.  Skin:    General: Skin is dry.     Findings: Rash (vesicular cluster on erythematous base of T1,2 and C4,5 dermatomes from left chest, left upper shoulder and left back/ Does not cross midline. Nontender) present.  Neurological:     General: No focal deficit present.     Mental Status: She is alert. Mental status is at baseline.     Motor: No weakness.     Gait: Gait normal.  Psychiatric:        Mood and Affect: Mood normal.        Behavior: Behavior normal.        Thought Content: Thought content normal.      UC Treatments / Results  Labs (all labs ordered are listed, but only abnormal results are displayed) Labs Reviewed  VARICELLA-ZOSTER BY PCR    EKG   Radiology No results found.  Procedures Procedures (including critical care time)  Medications Ordered in UC Medications - No data to display  Initial Impression / Assessment and Plan / UC Course  I have reviewed the triage vital signs and the nursing notes.  Pertinent labs & imaging results that were available during my care of the patient were reviewed by me and considered in  my medical decision making (see chart for details).   51 year old female presents for pruritic vesicular rash of the left side of her chest, shoulder and upper back.  Rash started 4 to 5 days ago and continues to spread.  She denies associated pain.  On examination she has vesicular  clustered rash on erythematous base following along the T1,2 and C4,5 dermatomes of the left chest, wrapping around the top of the left shoulder and upper back.  The rash is nontender to palpation.  Area swabbed for VZV testing.  Suspect this is shingles but patient is not having any associated pain which is out of the ordinary.  We will treat her for potential shingles while awaiting testing.  Advised if testing is negative, will send topical corticosteroid ointment for her.  Advised supportive care.  Advised to patient is contagious to if she does have shingles.  Reviewed return and ER precautions.   Final Clinical Impressions(s) / UC Diagnoses   Final diagnoses:  Vesicular rash     Discharge Instructions      -The rash that you have does look to be consistent with shingles.  We are testing the fluid to see but that will take a couple of days.  I have sent antiviral medication to the pharmacy for you to start right away.  As we discussed, it has been several days so it may not be as effective as it would have been if you would have started earlier. - If you are testing comes back negative for shingles you may discontinue it and we can send a topical steroid for you but I would avoid putting anything on the rash at this time.  He can take antihistamines for itching. - Try to keep it covered. - You are contagious to anyone who has not had chickenpox and you should avoid pregnant women, young children, babies, elderly or immunocompromised.     ED Prescriptions     Medication Sig Dispense Auth. Provider   valACYclovir (VALTREX) 1000 MG tablet Take 1 tablet (1,000 mg total) by mouth 3 (three) times daily for 7 days. 21 tablet Shirlee Latch, PA-C      PDMP not reviewed this encounter.   Shirlee Latch, PA-C 03/20/22 1058

## 2022-03-20 NOTE — ED Triage Notes (Signed)
Patient reports that she has a rash that started last week. Patient reports that it is on her chest and spreads to her left shoulder and around,   Patient reports that it is not pain but does itch a little.

## 2022-03-24 LAB — VARICELLA-ZOSTER BY PCR: Varicella-Zoster, PCR: POSITIVE — AB

## 2022-08-07 ENCOUNTER — Ambulatory Visit
Admission: EM | Admit: 2022-08-07 | Discharge: 2022-08-07 | Disposition: A | Discharge status: Home / Self Care | Payer: BC Managed Care – PPO

## 2022-08-07 ENCOUNTER — Encounter: Payer: Self-pay | Admitting: Emergency Medicine

## 2022-08-07 ENCOUNTER — Ambulatory Visit (INDEPENDENT_AMBULATORY_CARE_PROVIDER_SITE_OTHER): Payer: BC Managed Care – PPO

## 2022-08-07 DIAGNOSIS — M778 Other enthesopathies, not elsewhere classified: Secondary | ICD-10-CM

## 2022-08-07 DIAGNOSIS — M25531 Pain in right wrist: Secondary | ICD-10-CM | POA: Diagnosis not present

## 2022-08-07 NOTE — Discharge Instructions (Addendum)
-  The x-ray was normal. - You likely have overuse tendinitis or sprain of your wrist.  We have given you a wrist brace - Continue the dexamethasone.  It is okay if you want to take a dose of Aleve in the middle of the day.  It is also fine for you to take Tylenol and use topical Voltaren gel on the area. - If no improvement over the next week follow-up with orthopedics for further evaluation and treatment.  You have a condition requiring you to follow up with Orthopedics so please call one of the following office for appointment:   [facility] [address] [place], [place] [address] Phone: [phone]  [facility] [address], [place], [place] [address] Phone: [phone]

## 2022-08-07 NOTE — ED Triage Notes (Signed)
Patient states that 2 weeks ago she had put out mulch in her yard and started having right wrist pain.  Patient states that her pain has not gotten better after taking Prednisone.  Patient denies fall.

## 2022-08-07 NOTE — ED Provider Notes (Signed)
[facility]    CSN: [id number] Arrival date & time: 08/07/22  1015      History   Chief Complaint Chief Complaint  Patient presents with   Wrist Pain    right    HPI Tanya Cooper is a 52 y.o. female presenting for approximately 2-week history of right radial wrist pain.  Patient reports she was shoveling mulch in her yard for about an hour and a half when the pain began.  She says she actually felt the discomfort after performing this task.  She has not done so since but has continued to work and tight.  She reports increased pain with gripping and with moving the wrist.  She states she works with a provider who prescribed her dexamethasone which she has been taking for the past 1 week.  She thinks it was initially helpful but is not sure if it is helping anymore.  She has another week of this medication.  She has not tried any other medications.  She has been icing the wrist.  She denies any numbness, tingling or weakness.  She has never had any problems with this wrist before.  She has no other complaints.  HPI  Past Medical History:  Diagnosis Date   ASCUS of cervix with negative high risk HPV 01/01/2015   [place]   Premature menopause     Patient Active Problem List   Diagnosis Date Noted   Positive colorectal cancer screening using Cologuard test 09/11/2021    Past Surgical History:  Procedure Laterality Date   COLONOSCOPY WITH PROPOFOL N/A 10/16/2021   Procedure: COLONOSCOPY WITH PROPOFOL;  Surgeon: Lucilla Lame, MD;  Location: [place];  Service: Endoscopy;  Laterality: N/A;   COLPOSCOPY  04/2009   CRYOTHERAPY     [place]  07/12/2008    OB History     Gravida  3   Para  3   Term  3   Preterm      AB      Living  3      SAB      IAB      Ectopic      Multiple      Live Births  3            Home Medications    Prior to Admission medications   Medication Sig Start Date End Date Taking? Authorizing  Provider  BIOTIN PO Take by mouth.   Yes [provider]  Cholecalciferol (VITAMIN D PO) Take by mouth.   Yes [provider]  dexamethasone (DECADRON) 4 MG tablet Take 4 mg by mouth daily. 07/29/22  Yes [provider]  magnesium citrate SOLN Take 296 mLs by mouth once.   Yes [provider]  Omega-3 Fatty Acids (FISH OIL PO) Take by mouth.   Yes [provider]  pseudoephedrine (SUDAFED) 120 MG 12 hr tablet Take 120 mg by mouth daily.   Yes [provider]  Turmeric (QC TUMERIC COMPLEX PO) Take by mouth.   Yes [provider]  vitamin E 180 MG (400 UNITS) capsule Take 400 Units by mouth daily.   Yes [provider]    Family History Family History  Problem Relation Age of Onset   Breast cancer Paternal Aunt 44       1 aunt and 1 great aunt   Hypertension Mother    Breast cancer Maternal Aunt 62    Social History Social History   Tobacco Use  Smoking status: Never   Smokeless tobacco: Never  Vaping Use   Vaping Use: Never used  Substance Use Topics   Alcohol use: Yes    Comment: social   Drug use: No     Allergies   Patient has no known allergies.   Review of Systems Review of Systems  Musculoskeletal:  Positive for arthralgias and joint swelling.  Skin:  Negative for color change and wound.  Neurological:  Negative for weakness and numbness.     Physical Exam Triage Vital Signs ED Triage Vitals  Enc Vitals Group     BP 08/07/22 1118 133/89     Pulse Rate 08/07/22 1118 60     Resp 08/07/22 1118 14     Temp 08/07/22 1118 98.3 F (36.8 C)     Temp Source 08/07/22 1118 Oral     SpO2 08/07/22 1118 100 %     Weight 08/07/22 1115 130 lb (59 kg)     Height 08/07/22 1115 '5\' 4"'$  (1.626 m)     Head Circumference --      Peak Flow --      Pain Score 08/07/22 1115 7     Pain Loc --      Pain Edu? --      Excl. in [place]? --    No data found.  Updated Vital Signs BP 133/89 (BP Location: Left  Arm)   Pulse 60   Temp 98.3 F (36.8 C) (Oral)   Resp 14   Ht '5\' 4"'$  (1.626 m)   Wt 130 lb (59 kg)   SpO2 100%   BMI 22.31 kg/m       Physical Exam Vitals and nursing note reviewed.  Constitutional:      General: She is not in acute distress.    Appearance: Normal appearance. She is not ill-appearing or toxic-appearing.  HENT:     Head: Normocephalic and atraumatic.  Eyes:     General: No scleral icterus.       Right eye: No discharge.        Left eye: No discharge.     Conjunctiva/sclera: Conjunctivae normal.  Cardiovascular:     Rate and Rhythm: Normal rate and regular rhythm.     Pulses: Normal pulses.  Pulmonary:     Effort: Pulmonary effort is normal. No respiratory distress.  Musculoskeletal:     Right wrist: Swelling (mild swelling dorsal radial wrist) and tenderness (dorsal radial wrist) present. No bony tenderness or snuff box tenderness. Normal range of motion. Normal pulse.     Right hand: Normal.     Cervical back: Neck supple.  Skin:    General: Skin is dry.  Neurological:     General: No focal deficit present.     Mental Status: She is alert. Mental status is at baseline.     Motor: No weakness.     Gait: Gait normal.  Psychiatric:        Mood and Affect: Mood normal.        Behavior: Behavior normal.        Thought Content: Thought content normal.      UC Treatments / Results  Labs (all labs ordered are listed, but only abnormal results are displayed) Labs Reviewed - No data to display  EKG   Radiology DG Wrist Complete Right  Result Date: 08/07/2022 CLINICAL DATA:  Wrist pain for 2 weeks. EXAM: RIGHT WRIST - COMPLETE 3+ VIEW COMPARISON:  None Available. FINDINGS: There is no evidence of  fracture or dislocation. There is no evidence of arthropathy or other focal bone abnormality. Soft tissues are unremarkable. IMPRESSION: Negative. Electronically Signed   By: Misty Stanley M.D.   On: 08/07/2022 11:33    Procedures Procedures (including  critical care time)  Medications Ordered in UC Medications - No data to display  Initial Impression / Assessment and Plan / UC Course  I have reviewed the triage vital signs and the nursing notes.  Pertinent labs & imaging results that were available during my care of the patient were reviewed by me and considered in my medical decision making (see chart for details).   52 year old female presents for right wrist pain for the past 2 weeks.  Symptoms began after selling mulch in her yard.  Has been taking dexamethasone without much relief.  Has another week of this medication.  Has also been using ice and a soft wrist wrap.  X-ray performed today is normal.  Discussed results with patient.  Suspect overuse tendinitis or wrist sprain.  Encouraged her to finish out the dexamethasone.  Advise she can take naproxen in the afternoon if needed and Tylenol as needed.  Patient provided with a wrist brace in clinic.  Advised if no improvement over the next week or symptoms worsen to follow-up with orthopedics for further evaluation and treatment.   Final Clinical Impressions(s) / UC Diagnoses   Final diagnoses:  Right wrist tendonitis  Right wrist pain     Discharge Instructions      -The x-ray was normal. - You likely have overuse tendinitis or sprain of your wrist.  We have given you a wrist brace - Continue the dexamethasone.  It is okay if you want to take a dose of Aleve in the middle of the day.  It is also fine for you to take Tylenol and use topical Voltaren gel on the area. - If no improvement over the next week follow-up with orthopedics for further evaluation and treatment.  You have a condition requiring you to follow up with Orthopedics so please call one of the following office for appointment:   [facility] [address] [place], [place] [address] Phone: [phone]  [facility] [address], [place], [place] [address] Phone: [phone]     ED  Prescriptions   None    PDMP not reviewed this encounter.   Danton Clap, PA-C 08/07/22 1204

## 2022-08-19 ENCOUNTER — Encounter: Payer: Self-pay | Admitting: Obstetrics and Gynecology

## 2022-08-19 DIAGNOSIS — Z1231 Encounter for screening mammogram for malignant neoplasm of breast: Secondary | ICD-10-CM

## 2022-10-06 ENCOUNTER — Encounter: Payer: Self-pay | Admitting: Obstetrics and Gynecology

## 2022-10-06 ENCOUNTER — Ambulatory Visit (INDEPENDENT_AMBULATORY_CARE_PROVIDER_SITE_OTHER): Payer: BC Managed Care – PPO | Admitting: Obstetrics and Gynecology

## 2022-10-06 ENCOUNTER — Ambulatory Visit: Payer: BC Managed Care – PPO | Admitting: Obstetrics and Gynecology

## 2022-10-06 VITALS — BP 102/70 | Ht 64.0 in | Wt 138.0 lb

## 2022-10-06 DIAGNOSIS — E28319 Asymptomatic premature menopause: Secondary | ICD-10-CM

## 2022-10-06 DIAGNOSIS — Z01419 Encounter for gynecological examination (general) (routine) without abnormal findings: Secondary | ICD-10-CM

## 2022-10-06 DIAGNOSIS — Z1231 Encounter for screening mammogram for malignant neoplasm of breast: Secondary | ICD-10-CM

## 2022-10-06 NOTE — Progress Notes (Signed)
PCP: Duanne Limerick, MD   Chief Complaint  Patient presents with   Gynecologic Exam    No concerns    HPI:      [name] is a 52 y.o. [phone] whose LMP was No LMP recorded. Patient is postmenopausal., presents today for her annual examination.  Her menses are absent due to premature menopause age 56; not interested in HRT. No PMB. She does have vasomotor sx.   Has had issues with wt gain over past few yrs. Is extremely active, eats well. Frustrated because weight hasn't changed. Is taking probiotics.   Sex activity: single partner, contraception - post menopausal status. She does have vaginal dryness, improved with lubricants.  Last Pap: 08/11/21 Results were: no abnormalities /neg HPV DNA. S/p colpo 2010.  Last mammogram: 09/01/21  Results were: normal--routine follow-up in 12 months There is a FH of breast cancer in her mat aunt, pat aunt and pat grt aunt, genetic testing not indicated for pt. There is no FH of ovarian cancer. The patient does not do self-breast exams.  Colonoscopy: 5/23 after positive Cologuard with Dr. Servando Snare; neg colonoscopy; repeat after 10 yrs  Tobacco use: The patient denies current or previous tobacco use. Alcohol use: none No drug use Exercise: very active  She does not get adequate calcium but does get Vitamin D in her diet.  Normal labs 3/23. Increased total chol but has HDL=105.   Past Medical History:  Diagnosis Date   ASCUS of cervix with negative high risk HPV 01/01/2015   [place]   Premature menopause     Past Surgical History:  Procedure Laterality Date   COLONOSCOPY WITH PROPOFOL N/A 10/16/2021   Procedure: COLONOSCOPY WITH PROPOFOL;  Surgeon: Midge Minium, MD;  Location: [facility];  Service: Endoscopy;  Laterality: N/A;   COLPOSCOPY  04/2009   CRYOTHERAPY     [place]  07/12/2008    Family History  Problem Relation Age of Onset   Breast cancer Paternal Aunt 42       1 aunt and 1 great aunt    Hypertension Mother    Breast cancer Maternal Aunt 67    Social History   Socioeconomic History   Marital status: Married    Spouse name: Not on file   Number of children: Not on file   Years of education: Not on file   Highest education level: Not on file  Occupational History   Not on file  Tobacco Use   Smoking status: Never   Smokeless tobacco: Never  Vaping Use   Vaping Use: Never used  Substance and Sexual Activity   Alcohol use: Yes    Comment: social   Drug use: No   Sexual activity: Yes    Birth control/protection: Post-menopausal  Other Topics Concern   Not on file  Social History Narrative   Not on file   Social Determinants of Health   Financial Resource Strain: Not on file  Food Insecurity: Not on file  Transportation Needs: Not on file  Physical Activity: Not on file  Stress: Not on file  Social Connections: Not on file  Intimate Partner Violence: Not on file     Current Outpatient Medications:    BIOTIN PO, Take by mouth., Disp: , Rfl:    Cholecalciferol (VITAMIN D PO), Take by mouth., Disp: , Rfl:    magnesium citrate SOLN, Take 296 mLs by mouth once., Disp: , Rfl:    Omega-3 Fatty Acids (FISH OIL PO), Take  by mouth., Disp: , Rfl:    Turmeric (QC TUMERIC COMPLEX PO), Take by mouth., Disp: , Rfl:    vitamin E 180 MG (400 UNITS) capsule, Take 400 Units by mouth daily., Disp: , Rfl:      ROS:  Review of Systems  Constitutional:  Negative for fatigue, fever and unexpected weight change.  Respiratory:  Negative for cough, shortness of breath and wheezing.   Cardiovascular:  Negative for chest pain, palpitations and leg swelling.  Gastrointestinal:  Negative for blood in stool, constipation, diarrhea, nausea and vomiting.  Endocrine: Negative for cold intolerance, heat intolerance and polyuria.  Genitourinary:  Negative for dyspareunia, dysuria, flank pain, frequency, genital sores, hematuria, menstrual problem, pelvic pain, urgency, vaginal  bleeding, vaginal discharge and vaginal pain.  Musculoskeletal:  Negative for back pain, joint swelling and myalgias.  Skin:  Negative for rash.  Neurological:  Negative for dizziness, syncope, light-headedness, numbness and headaches.  Hematological:  Negative for adenopathy.  Psychiatric/Behavioral:  Negative for agitation, confusion, sleep disturbance and suicidal ideas. The patient is not nervous/anxious.    BREAST: No symptoms    Objective: BP 102/70   Ht 5\' 4"  (1.626 m)   Wt 138 lb (62.6 kg)   BMI 23.69 kg/m    Physical Exam Constitutional:      Appearance: She is well-developed.  Genitourinary:     Vulva normal.     Right Labia: No rash, tenderness or lesions.    Left Labia: No tenderness, lesions or rash.    No vaginal discharge, erythema or tenderness.      Right Adnexa: not tender and no mass present.    Left Adnexa: not tender and no mass present.    No cervical friability or polyp.     Uterus is not enlarged or tender.  Breasts:    Right: No mass, nipple discharge, skin change or tenderness.     Left: No mass, nipple discharge, skin change or tenderness.  Neck:     Thyroid: No thyromegaly.  Cardiovascular:     Rate and Rhythm: Normal rate and regular rhythm.     Heart sounds: Normal heart sounds. No murmur heard. Pulmonary:     Effort: Pulmonary effort is normal.     Breath sounds: Normal breath sounds.  Abdominal:     Palpations: Abdomen is soft.     Tenderness: There is no abdominal tenderness. There is no guarding or rebound.  Musculoskeletal:        General: Normal range of motion.     Cervical back: Normal range of motion.  Lymphadenopathy:     Cervical: No cervical adenopathy.  Neurological:     General: No focal deficit present.     Mental Status: She is alert and oriented to person, place, and time.     Cranial Nerves: No cranial nerve deficit.  Skin:    General: Skin is warm and dry.  Psychiatric:        Mood and Affect: Mood normal.         Behavior: Behavior normal.        Thought Content: Thought content normal.        Judgment: Judgment normal.  Vitals reviewed.     Assessment/Plan:  Encounter for annual routine gynecological examination  Encounter for screening mammogram for malignant neoplasm of breast - Plan: MM 3D SCREENING MAMMOGRAM BILATERAL BREAST; pt to schedule mammo  Premature menopause--declines HRT. Start DEXA age 41          GYN counsel  breast self exam, mammography screening, menopause, adequate intake of calcium and vitamin D, diet and exercise    F/U  Return in about 1 year (around 10/06/2023).  Jennife Zaucha B. Andri Prestia, PA-C 10/06/2022 1:56 PM

## 2022-10-06 NOTE — Patient Instructions (Addendum)
I value your feedback and you entrusting us with your care. If you get a [facility] patient survey, I would appreciate you taking the time to let us know about your experience today. Thank you!  [facility]: [phone]      

## 2022-11-09 ENCOUNTER — Ambulatory Visit
Admission: RE | Admit: 2022-11-09 | Discharge: 2022-11-09 | Disposition: A | Discharge status: Home / Self Care | Payer: BC Managed Care – PPO | Source: Ambulatory Visit | Attending: Obstetrics and Gynecology | Admitting: Obstetrics and Gynecology

## 2022-11-09 DIAGNOSIS — Z1231 Encounter for screening mammogram for malignant neoplasm of breast: Secondary | ICD-10-CM | POA: Diagnosis present

## 2022-11-12 ENCOUNTER — Encounter: Payer: Self-pay | Admitting: Obstetrics and Gynecology

## 2022-11-12 DIAGNOSIS — Z780 Asymptomatic menopausal state: Secondary | ICD-10-CM

## 2022-11-12 DIAGNOSIS — Z1329 Encounter for screening for other suspected endocrine disorder: Secondary | ICD-10-CM

## 2022-11-12 DIAGNOSIS — Z0000 Encounter for general adult medical examination without abnormal findings: Secondary | ICD-10-CM

## 2022-11-12 DIAGNOSIS — R7301 Impaired fasting glucose: Secondary | ICD-10-CM

## 2022-12-01 LAB — T4, FREE: Free T4: 1.3 ng/dL (ref 0.82–1.77)

## 2022-12-01 LAB — HEMOGLOBIN A1C
Est. average glucose Bld gHb Est-mCnc: 97 mg/dL
Hgb A1c MFr Bld: 5 % (ref 4.8–5.6)

## 2022-12-01 LAB — ESTRADIOL: Estradiol: <5 pg/mL

## 2022-12-01 LAB — INSULIN, RANDOM: INSULIN: 2.9 u[IU]/mL (ref 2.6–24.9)

## 2022-12-01 LAB — T3: T3, Total: 78 ng/dL (ref 71–180)

## 2022-12-02 LAB — TSH: TSH: 2.39 u[IU]/mL (ref 0.450–4.500)

## 2022-12-03 LAB — PROGESTERONE: Progesterone: <0.1 ng/mL

## 2023-04-08 ENCOUNTER — Encounter: Payer: Self-pay | Admitting: Obstetrics and Gynecology

## 2023-04-08 DIAGNOSIS — E28319 Asymptomatic premature menopause: Secondary | ICD-10-CM

## 2023-04-08 DIAGNOSIS — Z8781 Personal history of (healed) traumatic fracture: Secondary | ICD-10-CM

## 2023-05-05 ENCOUNTER — Ambulatory Visit
Admission: RE | Admit: 2023-05-05 | Discharge: 2023-05-05 | Disposition: A | Discharge status: Home / Self Care | Payer: BC Managed Care – PPO | Source: Ambulatory Visit | Attending: Obstetrics and Gynecology | Admitting: Obstetrics and Gynecology

## 2023-05-05 DIAGNOSIS — Z8781 Personal history of (healed) traumatic fracture: Secondary | ICD-10-CM | POA: Diagnosis present

## 2023-05-05 DIAGNOSIS — E28319 Asymptomatic premature menopause: Secondary | ICD-10-CM | POA: Diagnosis present

## 2023-10-07 NOTE — Progress Notes (Unsigned)
 PCP: Matthias Sor, PA-C   No chief complaint on file.   HPI:      [name] is a 53 y.o. [phone] whose LMP was No LMP recorded. Patient is postmenopausal., presents today for her annual examination.  Her menses are absent due to premature menopause age 57; not interested in HRT. No PMB. She does have vasomotor sx.   Has had issues with wt gain over past few yrs. Is extremely active, eats well. Frustrated because weight hasn't changed. Is taking probiotics.   Sex activity: single partner, contraception - post menopausal status. She does have vaginal dryness, improved with lubricants.  Last Pap: 08/11/21 Results were: no abnormalities /neg HPV DNA. S/p colpo 2010.  Last mammogram: 11/09/22  Results were: normal--routine follow-up in 12 months There is a FH of breast cancer in her mat aunt, pat aunt and pat grt aunt, genetic testing not indicated for pt. There is no FH of ovarian cancer. The patient does not do self-breast exams.  Colonoscopy:  5/23 neg colonoscopy with Dr. Ole Berkeley after 3/23 positive Cologuard; repeat after 10 yrs  Tobacco use: The patient denies current or previous tobacco use. Alcohol use: none No drug use Exercise: very active  She does not get adequate calcium but does get Vitamin D in her diet.  Normal labs 3/23. Increased total chol/slight LDL but has HDL=105.   Past Medical History:  Diagnosis Date   ASCUS of cervix with negative high risk HPV 01/01/2015   [place]   Premature menopause     Past Surgical History:  Procedure Laterality Date   COLONOSCOPY WITH PROPOFOL  N/A 10/16/2021   Procedure: COLONOSCOPY WITH PROPOFOL ;  Surgeon: Marnee Sink, MD;  Location: [facility];  Service: Endoscopy;  Laterality: N/A;   COLPOSCOPY  04/2009   CRYOTHERAPY     [place]  07/12/2008    Family History  Problem Relation Age of Onset   Breast cancer Paternal Aunt 34       1 aunt and 1 great aunt   Hypertension Mother    Breast cancer  Maternal Aunt 1    Social History   Socioeconomic History   Marital status: Married    Spouse name: Not on file   Number of children: Not on file   Years of education: Not on file   Highest education level: Not on file  Occupational History   Not on file  Tobacco Use   Smoking status: Never   Smokeless tobacco: Never  Vaping Use   Vaping status: Never Used  Substance and Sexual Activity   Alcohol use: Yes    Comment: social   Drug use: No   Sexual activity: Yes    Birth control/protection: Post-menopausal  Other Topics Concern   Not on file  Social History Narrative   Not on file   Social Drivers of Health   Financial Resource Strain: Not on file  Food Insecurity: Not on file  Transportation Needs: Not on file  Physical Activity: Not on file  Stress: Not on file  Social Connections: Not on file  Intimate Partner Violence: Not on file     Current Outpatient Medications:    BIOTIN PO, Take by mouth., Disp: , Rfl:    Cholecalciferol (VITAMIN D PO), Take by mouth., Disp: , Rfl:    magnesium citrate SOLN, Take 296 mLs by mouth once., Disp: , Rfl:    Omega-3 Fatty Acids (FISH OIL PO), Take by mouth., Disp: , Rfl:  Turmeric (QC TUMERIC COMPLEX PO), Take by mouth., Disp: , Rfl:    vitamin E 180 MG (400 UNITS) capsule, Take 400 Units by mouth daily., Disp: , Rfl:      ROS:  Review of Systems  Constitutional:  Negative for fatigue, fever and unexpected weight change.  Respiratory:  Negative for cough, shortness of breath and wheezing.   Cardiovascular:  Negative for chest pain, palpitations and leg swelling.  Gastrointestinal:  Negative for blood in stool, constipation, diarrhea, nausea and vomiting.  Endocrine: Negative for cold intolerance, heat intolerance and polyuria.  Genitourinary:  Negative for dyspareunia, dysuria, flank pain, frequency, genital sores, hematuria, menstrual problem, pelvic pain, urgency, vaginal bleeding, vaginal discharge and vaginal  pain.  Musculoskeletal:  Negative for back pain, joint swelling and myalgias.  Skin:  Negative for rash.  Neurological:  Negative for dizziness, syncope, light-headedness, numbness and headaches.  Hematological:  Negative for adenopathy.  Psychiatric/Behavioral:  Negative for agitation, confusion, sleep disturbance and suicidal ideas. The patient is not nervous/anxious.    BREAST: No symptoms    Objective: There were no vitals taken for this visit.   Physical Exam Constitutional:      Appearance: She is well-developed.  Genitourinary:     Vulva normal.     Right Labia: No rash, tenderness or lesions.    Left Labia: No tenderness, lesions or rash.    No vaginal discharge, erythema or tenderness.      Right Adnexa: not tender and no mass present.    Left Adnexa: not tender and no mass present.    No cervical friability or polyp.     Uterus is not enlarged or tender.  Breasts:    Right: No mass, nipple discharge, skin change or tenderness.     Left: No mass, nipple discharge, skin change or tenderness.  Neck:     Thyroid : No thyromegaly.  Cardiovascular:     Rate and Rhythm: Normal rate and regular rhythm.     Heart sounds: Normal heart sounds. No murmur heard. Pulmonary:     Effort: Pulmonary effort is normal.     Breath sounds: Normal breath sounds.  Abdominal:     Palpations: Abdomen is soft.     Tenderness: There is no abdominal tenderness. There is no guarding or rebound.  Musculoskeletal:        General: Normal range of motion.     Cervical back: Normal range of motion.  Lymphadenopathy:     Cervical: No cervical adenopathy.  Neurological:     General: No focal deficit present.     Mental Status: She is alert and oriented to person, place, and time.     Cranial Nerves: No cranial nerve deficit.  Skin:    General: Skin is warm and dry.  Psychiatric:        Mood and Affect: Mood normal.        Behavior: Behavior normal.        Thought Content: Thought content  normal.        Judgment: Judgment normal.  Vitals reviewed.     Assessment/Plan:  Encounter for annual routine gynecological examination  Encounter for screening mammogram for malignant neoplasm of breast - Plan: MM 3D SCREENING MAMMOGRAM BILATERAL BREAST; pt to schedule mammo  Premature menopause--declines HRT. Start DEXA age 20          GYN counsel breast self exam, mammography screening, menopause, adequate intake of calcium and vitamin D, diet and exercise    F/U  No  follow-ups on file.  Tanya Cooper B. Marcial Pless, PA-C 10/07/2023 2:43 PM

## 2023-10-11 ENCOUNTER — Encounter: Payer: Self-pay | Admitting: Obstetrics and Gynecology

## 2023-10-11 ENCOUNTER — Ambulatory Visit (INDEPENDENT_AMBULATORY_CARE_PROVIDER_SITE_OTHER): Payer: BC Managed Care – PPO | Admitting: Obstetrics and Gynecology

## 2023-10-11 VITALS — BP 120/76 | HR 70 | Ht 64.0 in | Wt 124.0 lb

## 2023-10-11 DIAGNOSIS — E28319 Asymptomatic premature menopause: Secondary | ICD-10-CM | POA: Insufficient documentation

## 2023-10-11 DIAGNOSIS — Z01419 Encounter for gynecological examination (general) (routine) without abnormal findings: Secondary | ICD-10-CM | POA: Diagnosis not present

## 2023-10-11 DIAGNOSIS — M8580 Other specified disorders of bone density and structure, unspecified site: Secondary | ICD-10-CM | POA: Insufficient documentation

## 2023-10-11 DIAGNOSIS — Z1231 Encounter for screening mammogram for malignant neoplasm of breast: Secondary | ICD-10-CM

## 2023-10-11 NOTE — Patient Instructions (Signed)
 I value your feedback and you entrusting [place] with your care. If you get a [facility] patient survey, I would appreciate you taking the time to let us know about your experience today. Thank you!  [facility] ([place])--[phone]

## 2023-11-17 ENCOUNTER — Ambulatory Visit
Admission: RE | Admit: 2023-11-17 | Discharge: 2023-11-17 | Disposition: A | Discharge status: Home / Self Care | Source: Ambulatory Visit | Attending: Obstetrics and Gynecology | Admitting: Obstetrics and Gynecology

## 2023-11-17 DIAGNOSIS — Z1231 Encounter for screening mammogram for malignant neoplasm of breast: Secondary | ICD-10-CM | POA: Diagnosis present

## 2023-11-21 ENCOUNTER — Ambulatory Visit: Payer: Self-pay | Admitting: Obstetrics and Gynecology

## 2024-01-19 ENCOUNTER — Encounter: Payer: Self-pay | Admitting: Obstetrics and Gynecology

## 2024-01-20 ENCOUNTER — Other Ambulatory Visit: Payer: Self-pay | Admitting: Obstetrics and Gynecology

## 2024-01-20 MED ORDER — ESTRADIOL 0.075 MG/24HR TD PTTW: 1.0000 | MEDICATED_PATCH | TRANSDERMAL | 2 refills | Status: AC

## 2024-01-20 MED ORDER — PROGESTERONE 200 MG PO CAPS: 200.0000 mg | ORAL_CAPSULE | Freq: Every day | ORAL | 2 refills | Status: AC

## 2024-01-20 NOTE — Progress Notes (Signed)
 Rx RF HRT since getting elsewhere. Annual due 5/26

## 2024-01-25 ENCOUNTER — Other Ambulatory Visit: Payer: Self-pay | Admitting: Obstetrics and Gynecology

## 2024-01-25 MED ORDER — AMBULATORY NON FORMULARY MEDICATION: 9 refills | Status: AC

## 2024-01-25 NOTE — Progress Notes (Signed)
 Rx testosterone RF to compound at [facility], changing from different provider
